# Patient Record
Sex: Male | Born: 1970 | Hispanic: No | Marital: Married | Smoking: Current every day smoker
Health system: Southern US, Community
[De-identification: ages and names within clinical notes are randomized; demographics above are authoritative.]

## PROBLEM LIST (undated history)

## (undated) DIAGNOSIS — F329 Major depressive disorder, single episode, unspecified: Secondary | ICD-10-CM

## (undated) DIAGNOSIS — E119 Type 2 diabetes mellitus without complications: Secondary | ICD-10-CM

## (undated) DIAGNOSIS — F32A Depression, unspecified: Secondary | ICD-10-CM

## (undated) DIAGNOSIS — K921 Melena: Secondary | ICD-10-CM

## (undated) DIAGNOSIS — N200 Calculus of kidney: Secondary | ICD-10-CM

## (undated) HISTORY — PX: OTHER SURGICAL HISTORY: SHX169

## (undated) HISTORY — PX: BACK SURGERY: SHX140

## (undated) HISTORY — PX: UMBILICAL HERNIA REPAIR: SHX196

---

## 1989-04-30 HISTORY — PX: FINGER NAIL SURGERY: SHX717

## 2001-04-04 ENCOUNTER — Inpatient Hospital Stay (HOSPITAL_COMMUNITY): Admission: EM | Admit: 2001-04-04 | Discharge: 2001-04-10 | Payer: Self-pay | Admitting: Psychiatry

## 2013-01-05 ENCOUNTER — Other Ambulatory Visit (HOSPITAL_COMMUNITY): Payer: Self-pay | Admitting: Orthopaedic Surgery

## 2013-01-09 ENCOUNTER — Encounter (HOSPITAL_COMMUNITY): Payer: Self-pay | Admitting: *Deleted

## 2013-01-09 MED ORDER — CEFAZOLIN SODIUM-DEXTROSE 2-3 GM-% IV SOLR
2.0000 g | INTRAVENOUS | Status: AC
  Administered 2013-01-10: 2 g via INTRAVENOUS
  Filled 2013-01-09: qty 50

## 2013-01-10 ENCOUNTER — Ambulatory Visit (HOSPITAL_COMMUNITY): Payer: Worker's Compensation | Admitting: Anesthesiology

## 2013-01-10 ENCOUNTER — Ambulatory Visit (HOSPITAL_COMMUNITY): Payer: Worker's Compensation

## 2013-01-10 ENCOUNTER — Observation Stay (HOSPITAL_COMMUNITY)
Admission: RE | Admit: 2013-01-10 | Discharge: 2013-01-11 | Disposition: A | Discharge status: Home / Self Care | Payer: Worker's Compensation | Source: Ambulatory Visit | Attending: Orthopaedic Surgery | Admitting: Orthopaedic Surgery

## 2013-01-10 ENCOUNTER — Encounter (HOSPITAL_COMMUNITY): Payer: Self-pay | Admitting: Pharmacy Technician

## 2013-01-10 ENCOUNTER — Encounter (HOSPITAL_COMMUNITY)
Admission: RE | Disposition: A | Discharge status: Home / Self Care | Payer: Self-pay | Source: Ambulatory Visit | Attending: Orthopaedic Surgery

## 2013-01-10 ENCOUNTER — Encounter (HOSPITAL_COMMUNITY): Payer: Self-pay | Admitting: Anesthesiology

## 2013-01-10 ENCOUNTER — Encounter (HOSPITAL_COMMUNITY): Payer: Self-pay

## 2013-01-10 DIAGNOSIS — R262 Difficulty in walking, not elsewhere classified: Secondary | ICD-10-CM | POA: Insufficient documentation

## 2013-01-10 DIAGNOSIS — E119 Type 2 diabetes mellitus without complications: Secondary | ICD-10-CM | POA: Insufficient documentation

## 2013-01-10 DIAGNOSIS — M5126 Other intervertebral disc displacement, lumbar region: Principal | ICD-10-CM | POA: Insufficient documentation

## 2013-01-10 HISTORY — DX: Type 2 diabetes mellitus without complications: E11.9

## 2013-01-10 HISTORY — DX: Major depressive disorder, single episode, unspecified: F32.9

## 2013-01-10 HISTORY — PX: LUMBAR LAMINECTOMY: SHX95

## 2013-01-10 HISTORY — DX: Depression, unspecified: F32.A

## 2013-01-10 HISTORY — DX: Melena: K92.1

## 2013-01-10 HISTORY — DX: Calculus of kidney: N20.0

## 2013-01-10 LAB — CBC
HCT: 41.2 % (ref 39.0–52.0)
Hemoglobin: 14.4 g/dL (ref 13.0–17.0)
MCH: 30.3 pg (ref 26.0–34.0)
MCHC: 35 g/dL (ref 30.0–36.0)
RDW: 13.1 % (ref 11.5–15.5)

## 2013-01-10 LAB — COMPREHENSIVE METABOLIC PANEL
Albumin: 3.3 g/dL — ABNORMAL LOW (ref 3.5–5.2)
BUN: 13 mg/dL (ref 6–23)
Calcium: 9.2 mg/dL (ref 8.4–10.5)
GFR calc Af Amer: >90 mL/min (ref >90)
Glucose, Bld: 280 mg/dL — ABNORMAL HIGH (ref 70–99)
Potassium: 4.1 mEq/L (ref 3.5–5.1)
Total Protein: 6.5 g/dL (ref 6.0–8.3)

## 2013-01-10 LAB — PROTIME-INR
INR: 0.95 (ref 0.00–1.49)
Prothrombin Time: 12.6 seconds (ref 11.6–15.2)

## 2013-01-10 LAB — SURGICAL PCR SCREEN: Staphylococcus aureus: NEGATIVE

## 2013-01-10 LAB — GLUCOSE, CAPILLARY
Glucose-Capillary: 255 mg/dL — ABNORMAL HIGH (ref 70–99)
Glucose-Capillary: 226 mg/dL — ABNORMAL HIGH (ref 70–99)

## 2013-01-10 SURGERY — MICRODISCECTOMY LUMBAR LAMINECTOMY
Anesthesia: General | Site: Back | Wound class: Clean

## 2013-01-10 MED ORDER — OXYCODONE-ACETAMINOPHEN 5-325 MG PO TABS
1.0000 | ORAL_TABLET | ORAL | Status: DC | PRN
  Administered 2013-01-10 – 2013-01-11 (×5): 2 via ORAL
  Filled 2013-01-10 (×5): qty 2

## 2013-01-10 MED ORDER — SENNOSIDES-DOCUSATE SODIUM 8.6-50 MG PO TABS: 1.0000 | ORAL_TABLET | Freq: Every evening | ORAL | Status: DC | PRN

## 2013-01-10 MED ORDER — CEFAZOLIN SODIUM 1-5 GM-% IV SOLN
1.0000 g | Freq: Three times a day (TID) | INTRAVENOUS | Status: AC
  Administered 2013-01-10 – 2013-01-11 (×2): 1 g via INTRAVENOUS
  Filled 2013-01-10 (×2): qty 50

## 2013-01-10 MED ORDER — HYDROMORPHONE HCL PF 1 MG/ML IJ SOLN
INTRAMUSCULAR | Status: AC
  Administered 2013-01-10: 0.5 mg
  Filled 2013-01-10: qty 1

## 2013-01-10 MED ORDER — KETOROLAC TROMETHAMINE 30 MG/ML IJ SOLN
30.0000 mg | Freq: Four times a day (QID) | INTRAMUSCULAR | Status: DC
  Administered 2013-01-10 – 2013-01-11 (×4): 30 mg via INTRAVENOUS
  Filled 2013-01-10 (×8): qty 1

## 2013-01-10 MED ORDER — ROCURONIUM BROMIDE 100 MG/10ML IV SOLN
INTRAVENOUS | Status: DC | PRN
  Administered 2013-01-10: 50 mg via INTRAVENOUS

## 2013-01-10 MED ORDER — FLEET ENEMA 7-19 GM/118ML RE ENEM: 1.0000 | ENEMA | Freq: Once | RECTAL | Status: AC | PRN

## 2013-01-10 MED ORDER — MIDAZOLAM HCL 5 MG/5ML IJ SOLN
INTRAMUSCULAR | Status: DC | PRN
  Administered 2013-01-10: 2 mg via INTRAVENOUS

## 2013-01-10 MED ORDER — NEOSTIGMINE METHYLSULFATE 1 MG/ML IJ SOLN
INTRAMUSCULAR | Status: DC | PRN
  Administered 2013-01-10: 3 mg via INTRAVENOUS

## 2013-01-10 MED ORDER — BUPIVACAINE HCL (PF) 0.25 % IJ SOLN
INTRAMUSCULAR | Status: AC
  Filled 2013-01-10: qty 30

## 2013-01-10 MED ORDER — METHOCARBAMOL 500 MG PO TABS: 500.0000 mg | ORAL_TABLET | Freq: Four times a day (QID) | ORAL | Status: DC | PRN

## 2013-01-10 MED ORDER — LIDOCAINE HCL (CARDIAC) 20 MG/ML IV SOLN
INTRAVENOUS | Status: DC | PRN
  Administered 2013-01-10: 60 mg via INTRAVENOUS

## 2013-01-10 MED ORDER — ZOLPIDEM TARTRATE 5 MG PO TABS: 5.0000 mg | ORAL_TABLET | Freq: Every evening | ORAL | Status: DC | PRN

## 2013-01-10 MED ORDER — SODIUM CHLORIDE 0.9 % IJ SOLN: 3.0000 mL | INTRAMUSCULAR | Status: DC | PRN

## 2013-01-10 MED ORDER — ACETAMINOPHEN 325 MG PO TABS: 650.0000 mg | ORAL_TABLET | ORAL | Status: DC | PRN

## 2013-01-10 MED ORDER — PROPOFOL 10 MG/ML IV BOLUS
INTRAVENOUS | Status: DC | PRN
  Administered 2013-01-10: 200 mg via INTRAVENOUS

## 2013-01-10 MED ORDER — OXYCODONE-ACETAMINOPHEN 5-325 MG PO TABS: 1.0000 | ORAL_TABLET | ORAL | Status: DC | PRN

## 2013-01-10 MED ORDER — SODIUM CHLORIDE 0.9 % IJ SOLN
3.0000 mL | Freq: Two times a day (BID) | INTRAMUSCULAR | Status: DC
  Administered 2013-01-11: 3 mL via INTRAVENOUS

## 2013-01-10 MED ORDER — CARBAMAZEPINE ER 200 MG PO TB12
200.0000 mg | ORAL_TABLET | Freq: Two times a day (BID) | ORAL | Status: DC
  Administered 2013-01-10 – 2013-01-11 (×2): 200 mg via ORAL
  Filled 2013-01-10 (×3): qty 1

## 2013-01-10 MED ORDER — THROMBIN 5000 UNITS EX SOLR
CUTANEOUS | Status: AC
  Filled 2013-01-10: qty 5000

## 2013-01-10 MED ORDER — MENTHOL 3 MG MT LOZG
1.0000 | LOZENGE | OROMUCOSAL | Status: DC | PRN
  Administered 2013-01-10: 3 mg via ORAL
  Filled 2013-01-10: qty 9

## 2013-01-10 MED ORDER — LAMOTRIGINE 200 MG PO TABS
200.0000 mg | ORAL_TABLET | Freq: Two times a day (BID) | ORAL | Status: DC
  Administered 2013-01-10 – 2013-01-11 (×2): 200 mg via ORAL
  Filled 2013-01-10 (×3): qty 1

## 2013-01-10 MED ORDER — PHENOL 1.4 % MT LIQD: 1.0000 | OROMUCOSAL | Status: DC | PRN

## 2013-01-10 MED ORDER — ONDANSETRON HCL 4 MG/2ML IJ SOLN
INTRAMUSCULAR | Status: DC | PRN
  Administered 2013-01-10: 4 mg via INTRAVENOUS

## 2013-01-10 MED ORDER — METHOCARBAMOL 500 MG PO TABS
500.0000 mg | ORAL_TABLET | Freq: Four times a day (QID) | ORAL | Status: DC | PRN
  Administered 2013-01-10 – 2013-01-11 (×3): 500 mg via ORAL
  Filled 2013-01-10 (×3): qty 1

## 2013-01-10 MED ORDER — 0.9 % SODIUM CHLORIDE (POUR BTL) OPTIME
TOPICAL | Status: DC | PRN
  Administered 2013-01-10: 1000 mL

## 2013-01-10 MED ORDER — HYDROMORPHONE HCL PF 1 MG/ML IJ SOLN
0.5000 mg | Freq: Once | INTRAMUSCULAR | Status: AC
  Administered 2013-01-10: 0.5 mg via INTRAVENOUS

## 2013-01-10 MED ORDER — MORPHINE SULFATE 2 MG/ML IJ SOLN: 1.0000 mg | INTRAMUSCULAR | Status: DC | PRN

## 2013-01-10 MED ORDER — DOCUSATE SODIUM 100 MG PO CAPS
100.0000 mg | ORAL_CAPSULE | Freq: Two times a day (BID) | ORAL | Status: DC
  Administered 2013-01-10 – 2013-01-11 (×2): 100 mg via ORAL
  Filled 2013-01-10 (×3): qty 1

## 2013-01-10 MED ORDER — ONDANSETRON HCL 4 MG/2ML IJ SOLN: 4.0000 mg | Freq: Once | INTRAMUSCULAR | Status: DC | PRN

## 2013-01-10 MED ORDER — METHOCARBAMOL 100 MG/ML IJ SOLN
500.0000 mg | Freq: Four times a day (QID) | INTRAVENOUS | Status: DC | PRN
  Filled 2013-01-10: qty 5

## 2013-01-10 MED ORDER — ACETAMINOPHEN 650 MG RE SUPP: 650.0000 mg | RECTAL | Status: DC | PRN

## 2013-01-10 MED ORDER — KCL IN DEXTROSE-NACL 20-5-0.45 MEQ/L-%-% IV SOLN
INTRAVENOUS | Status: DC
  Administered 2013-01-10: 19:00:00 via INTRAVENOUS
  Filled 2013-01-10 (×3): qty 1000

## 2013-01-10 MED ORDER — ONDANSETRON HCL 4 MG/2ML IJ SOLN: 4.0000 mg | INTRAMUSCULAR | Status: DC | PRN

## 2013-01-10 MED ORDER — LACTATED RINGERS IV SOLN: INTRAVENOUS | Status: DC

## 2013-01-10 MED ORDER — ACETAMINOPHEN 10 MG/ML IV SOLN
INTRAVENOUS | Status: AC
  Administered 2013-01-10: 1000 mg via INTRAVENOUS
  Filled 2013-01-10: qty 100

## 2013-01-10 MED ORDER — MUPIROCIN 2 % EX OINT
TOPICAL_OINTMENT | Freq: Two times a day (BID) | CUTANEOUS | Status: DC
  Administered 2013-01-10 – 2013-01-11 (×2): via NASAL

## 2013-01-10 MED ORDER — BISACODYL 10 MG RE SUPP: 10.0000 mg | Freq: Every day | RECTAL | Status: DC | PRN

## 2013-01-10 MED ORDER — HYDROCODONE-ACETAMINOPHEN 5-325 MG PO TABS
1.0000 | ORAL_TABLET | ORAL | Status: DC | PRN
  Administered 2013-01-10 – 2013-01-11 (×2): 2 via ORAL
  Filled 2013-01-10 (×2): qty 2

## 2013-01-10 MED ORDER — GLYCOPYRROLATE 0.2 MG/ML IJ SOLN
INTRAMUSCULAR | Status: DC | PRN
  Administered 2013-01-10: 0.4 mg via INTRAVENOUS

## 2013-01-10 MED ORDER — MORPHINE SULFATE 4 MG/ML IJ SOLN
INTRAMUSCULAR | Status: AC
  Filled 2013-01-10: qty 19

## 2013-01-10 MED ORDER — INSULIN ASPART 100 UNIT/ML ~~LOC~~ SOLN
8.0000 [IU] | Freq: Once | SUBCUTANEOUS | Status: AC
  Administered 2013-01-10: 8 [IU] via SUBCUTANEOUS
  Filled 2013-01-10: qty 0.08

## 2013-01-10 MED ORDER — ACETAMINOPHEN 10 MG/ML IV SOLN: 1000.0000 mg | Freq: Once | INTRAVENOUS | Status: DC | PRN

## 2013-01-10 MED ORDER — ARTIFICIAL TEARS OP OINT
TOPICAL_OINTMENT | OPHTHALMIC | Status: DC | PRN
  Administered 2013-01-10: 1 via OPHTHALMIC

## 2013-01-10 MED ORDER — MUPIROCIN 2 % EX OINT
TOPICAL_OINTMENT | CUTANEOUS | Status: AC
  Administered 2013-01-10: 1 via NASAL
  Filled 2013-01-10: qty 22

## 2013-01-10 MED ORDER — SODIUM CHLORIDE 0.9 % IV SOLN: 250.0000 mL | INTRAVENOUS | Status: DC

## 2013-01-10 MED ORDER — MORPHINE SULFATE 2 MG/ML IJ SOLN
1.0000 mg | INTRAMUSCULAR | Status: DC | PRN
  Administered 2013-01-10: 2 mg via INTRAVENOUS

## 2013-01-10 MED ORDER — HYDROMORPHONE HCL PF 1 MG/ML IJ SOLN
INTRAMUSCULAR | Status: AC
  Filled 2013-01-10: qty 1

## 2013-01-10 MED ORDER — LACTATED RINGERS IV SOLN
INTRAVENOUS | Status: DC | PRN
  Administered 2013-01-10: 12:00:00 via INTRAVENOUS

## 2013-01-10 MED ORDER — FENTANYL CITRATE 0.05 MG/ML IJ SOLN
INTRAMUSCULAR | Status: DC | PRN
  Administered 2013-01-10 (×2): 100 ug via INTRAVENOUS
  Administered 2013-01-10: 50 ug via INTRAVENOUS

## 2013-01-10 SURGICAL SUPPLY — 52 items
BLADE SURG CLIPPER 3M 9600 (MISCELLANEOUS) ×2 IMPLANT
BUR ROUND FLUTED 4 SOFT TCH (BURR) ×2 IMPLANT
CANISTER SUCTION 2500CC (MISCELLANEOUS) ×2 IMPLANT
CLOTH BEACON ORANGE TIMEOUT ST (SAFETY) ×2 IMPLANT
CORDS BIPOLAR (ELECTRODE) ×2 IMPLANT
COVER SURGICAL LIGHT HANDLE (MISCELLANEOUS) ×2 IMPLANT
DERMABOND ADVANCED (GAUZE/BANDAGES/DRESSINGS) ×1
DERMABOND ADVANCED .7 DNX12 (GAUZE/BANDAGES/DRESSINGS) ×1 IMPLANT
DRAPE MICROSCOPE LEICA (MISCELLANEOUS) ×2 IMPLANT
DRAPE PROXIMA HALF (DRAPES) IMPLANT
DRSG EMULSION OIL 3X3 NADH (GAUZE/BANDAGES/DRESSINGS) IMPLANT
DRSG MEPILEX BORDER 4X4 (GAUZE/BANDAGES/DRESSINGS) ×2 IMPLANT
DRSG MEPILEX BORDER 4X8 (GAUZE/BANDAGES/DRESSINGS) IMPLANT
DURAPREP 26ML APPLICATOR (WOUND CARE) ×2 IMPLANT
ELECT REM PT RETURN 9FT ADLT (ELECTROSURGICAL) ×2
ELECTRODE REM PT RTRN 9FT ADLT (ELECTROSURGICAL) ×1 IMPLANT
GLOVE BIOGEL PI IND STRL 7.0 (GLOVE) ×2 IMPLANT
GLOVE BIOGEL PI IND STRL 7.5 (GLOVE) ×1 IMPLANT
GLOVE BIOGEL PI IND STRL 8 (GLOVE) ×1 IMPLANT
GLOVE BIOGEL PI INDICATOR 7.0 (GLOVE) ×2
GLOVE BIOGEL PI INDICATOR 7.5 (GLOVE) ×1
GLOVE BIOGEL PI INDICATOR 8 (GLOVE) ×1
GLOVE ECLIPSE 7.0 STRL STRAW (GLOVE) ×2 IMPLANT
GLOVE ORTHO TXT STRL SZ7.5 (GLOVE) ×2 IMPLANT
GLOVE SURG SS PI 6.5 STRL IVOR (GLOVE) ×2 IMPLANT
GOWN PREVENTION PLUS LG XLONG (DISPOSABLE) ×4 IMPLANT
GOWN STRL NON-REIN LRG LVL3 (GOWN DISPOSABLE) ×4 IMPLANT
KIT BASIN OR (CUSTOM PROCEDURE TRAY) ×2 IMPLANT
KIT ROOM TURNOVER OR (KITS) ×2 IMPLANT
MANIFOLD NEPTUNE II (INSTRUMENTS) IMPLANT
NDL SUT .5 MAYO 1.404X.05X (NEEDLE) IMPLANT
NEEDLE HYPO 25GX1X1/2 BEV (NEEDLE) ×2 IMPLANT
NEEDLE MAYO TAPER (NEEDLE)
NEEDLE SPNL 18GX3.5 QUINCKE PK (NEEDLE) ×2 IMPLANT
NS IRRIG 1000ML POUR BTL (IV SOLUTION) ×2 IMPLANT
PACK LAMINECTOMY ORTHO (CUSTOM PROCEDURE TRAY) ×2 IMPLANT
PAD ARMBOARD 7.5X6 YLW CONV (MISCELLANEOUS) ×4 IMPLANT
PATTIES SURGICAL .5 X.5 (GAUZE/BANDAGES/DRESSINGS) IMPLANT
PATTIES SURGICAL .75X.75 (GAUZE/BANDAGES/DRESSINGS) IMPLANT
SPONGE GAUZE 4X4 12PLY (GAUZE/BANDAGES/DRESSINGS) IMPLANT
SUT VIC AB 0 CT1 27 (SUTURE) ×1
SUT VIC AB 0 CT1 27XBRD ANBCTR (SUTURE) ×1 IMPLANT
SUT VIC AB 2-0 CT1 27 (SUTURE) ×1
SUT VIC AB 2-0 CT1 TAPERPNT 27 (SUTURE) ×1 IMPLANT
SUT VICRYL 0 TIES 12 18 (SUTURE) IMPLANT
SUT VICRYL 4-0 PS2 18IN ABS (SUTURE) ×2 IMPLANT
SUT VICRYL AB 2 0 TIES (SUTURE) IMPLANT
SYR 20ML ECCENTRIC (SYRINGE) IMPLANT
SYR CONTROL 10ML LL (SYRINGE) ×2 IMPLANT
TOWEL OR 17X24 6PK STRL BLUE (TOWEL DISPOSABLE) ×2 IMPLANT
TOWEL OR 17X26 10 PK STRL BLUE (TOWEL DISPOSABLE) ×2 IMPLANT
WATER STERILE IRR 1000ML POUR (IV SOLUTION) IMPLANT

## 2013-01-10 NOTE — Anesthesia Postprocedure Evaluation (Signed)
  Anesthesia Post-op Note  Patient: Herbert Meyer  Procedure(s) Performed: Procedure(s) with comments: MICRODISCECTOMY LUMBAR LAMINECTOMY (N/A) - Bilateral Lumbar 4-5 Microdiscectomy  Patient Location: PACU  Anesthesia Type:General  Level of Consciousness: awake, alert  and oriented  Airway and Oxygen Therapy: Patient Spontanous Breathing and Patient connected to nasal cannula oxygen  Post-op Pain: mild  Post-op Assessment: Post-op Vital signs reviewed, Patient's Cardiovascular Status Stable, Respiratory Function Stable, Patent Airway and Pain level controlled  Post-op Vital Signs: stable  Complications: No apparent anesthesia complications

## 2013-01-10 NOTE — Progress Notes (Signed)
Pt. Reports that he has not been taking hypoglycemics, states stopped about 5 mths. Ago, ever since the the back problem he has not had meds. refilled for diabetes , therefore has not been getting treatment since. CBG- [phone] at 1245

## 2013-01-10 NOTE — Anesthesia Preprocedure Evaluation (Addendum)
Anesthesia Evaluation  Patient identified by MRN, date of birth, ID band Patient awake    Reviewed: Allergy & Precautions, H&P , NPO status , Patient's Chart, lab work & pertinent test results  History of Anesthesia Complications Negative for: history of anesthetic complications  Airway Mallampati: II TM Distance: >3 FB Neck ROM: Full    Dental  (+) Caps and Dental Advisory Given   Pulmonary Current Smoker,  breath sounds clear to auscultation        Cardiovascular negative cardio ROS  Rate:Normal     Neuro/Psych PSYCHIATRIC DISORDERS Depression  Neuromuscular disease (back pain)    GI/Hepatic negative GI ROS, Neg liver ROS,   Endo/Other  diabetes (has not taken meds x5 months), Type 2, Oral Hypoglycemic Agents  Renal/GU negative Renal ROS     Musculoskeletal  (+) Arthritis -, Osteoarthritis,    Abdominal   Peds negative pediatric ROS (+)  Hematology negative hematology ROS (+)   Anesthesia Other Findings   Reproductive/Obstetrics                       Anesthesia Physical Anesthesia Plan  ASA: III  Anesthesia Plan: General   Post-op Pain Management:    Induction: Intravenous  Airway Management Planned: Oral ETT  Additional Equipment:   Intra-op Plan:   Post-operative Plan: Extubation in OR  Informed Consent: I have reviewed the patients History and Physical, chart, labs and discussed the procedure including the risks, benefits and alternatives for the proposed anesthesia with the patient or authorized representative who has indicated his/her understanding and acceptance.   Dental advisory given  Plan Discussed with: CRNA and Anesthesiologist  Anesthesia Plan Comments: (HNP L4-5 Type 2 DM not taking meds, glucose 280 Smoker  Plan GA with oral ETT  Kipp Brood, MD)        Anesthesia Quick Evaluation

## 2013-01-10 NOTE — Progress Notes (Signed)
Pt 's son and spouse are present. His son speaks [place] and can interpret for spouse.

## 2013-01-10 NOTE — Brief Op Note (Cosign Needed)
01/10/2013  2:34 PM  PATIENT:  Herbert Meyer  42 y.o. 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  POST-OPERATIVE DIAGNOSIS:  L4-5 HNP  PROCEDURE:  Procedure(s) with comments: MICRODISCECTOMY LUMBAR LAMINECTOMY (N/A) - Bilateral Lumbar 4-5 Microdiscectomy  SURGEON:  Surgeon(s) and Role:    * Eldred Manges, MD - Primary  PHYSICIAN ASSISTANT:Emonee Winkowski Marita [place] PAC   ASSISTANTS: none   ANESTHESIA:   local and general  EBL:     BLOOD ADMINISTERED:none  DRAINS: none   LOCAL MEDICATIONS USED:  MARCAINE     SPECIMEN:  No Specimen  DISPOSITION OF SPECIMEN:  N/A  COUNTS:  YES  TOURNIQUET:  * No tourniquets in log *  DICTATION: .Note written in EPIC  PLAN OF CARE: Admit for overnight observation  PATIENT DISPOSITION:  PACU - hemodynamically stable.   Delay start of Pharmacological VTE agent (>24hrs) due to surgical blood loss or risk of bleeding: yes

## 2013-01-10 NOTE — H&P (Signed)
[facility]   A Division of [facility]   [address], [place], [place] [address] Telephone: [phone]  Fax: [phone]     PATIENT: Herbert Meyer, Herbert Meyer   MR#: [id number]  DOB: 03/30/1971       A 42 year old male returns with increasing pain, difficulty ambulating, 5 month history after on the job injury.  He has increased pain with activity, turning he has back pain, and right greater than left leg pain.  When he ambulates he starts having weakness in his legs, has to stop and sit.  When he sits he gets relief.  He has used Vicodin for pain and has had epidural steroid injections and states this did not give him relief.  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 on 11/09/2012.  Patient was on light work activity up until his epidural.  Work slip given on 12/04/2012, no work x2 weeks and he remains out of work.  He had been treated with muscle relaxants, anti-inflammatories, activity modification, has used tramadol, as well as Vicodin for pain without relief.     CURRENT MEDICATIONS:  He is a diabetic, he takes glipizide, metformin 5/500 daily, also sertraline 100 mg p.o. daily.     ALLERGIES:  He reports none.   PAST MEDICAL/SURGICAL HISTORY:  No previous surgery.   SOCIAL HISTORY:  He is married to his wife Herbert Meyer who is with him today.  He works in Production designer, theatre/television/film.  He is a nonsmoker.   FAMILY HISTORY:  Positive for diabetes.   REVIEW OF SYSTEMS:  Positive for diabetes.   PHYSICAL EXAMINATION:  Patient is alert and oriented, he is 5 feet 8 inches, 196 pounds, WD, WN, NAD.  Patient gets relief when he stops and leans over objects, he has increased pain after standing  for 10 minutes, gets relief with sitting or supine position and ambulation distance is limited to 2 blocks, which causes significant back pain and right leg weakness.  With forward neck flexion he complains of sharp low back pain, bilateral buttocks pain, and right greater than left leg pain.  He is able to get up slowly from sitting to standing position, ambulates in the office, can get on and off the exam table.  No accessory muscle inspiratory effort.  Hip range of motion is normal, negative Faber test.  There is tenderness to palpation at L4-5, L5-S1 interspace.  Right greater than left sciatic notch tenderness.  Positive straight leg raising at 70 degrees bilaterally right and left.  Negative prone reverse straight leg raising.  Skin over his back is normal.  Quads, hip flexors, hamstrings, are normal to strength testing.  Anterior tib, EHL, toe flexors, toe extensors, peroneals, and anterior tib remain normal with good strength.   RADIOGRAPHS:  MRI scan from 11/08/2012 showed some narrowing at 3-4.  There is significant central stenosis at the L4-5 level with subarticular lateral recess stenosis with large disk bulge, central disk protrusion causing significant stenosis.  The 5-1 level is non-compressed, there is minimal disk bulge and canal diameter is 11 mm. Patient remains stoic, he has been working, states that his pain has progressed to the point he cannot stand it any longer.  He has not  gotten relief with anti-inflammatories, been treated with a Medrol Dosepak, pain medication, activity modification.  Rehab nurse, Dianah Field, R.N., BSN is present and I reviewed scan with the patient, patient's wife, also the rehab nurse, Dianah Field, R.N.  Onset of pain was on 08/31/2012 when he was picking up a metal bar that weighed approximately 150 pounds and had sharp pain in his back radiating to his buttocks, right greater than left buttocks.   ASSESSMENT:  L4-5 disk protrusion with bilateral subarticular  lateral recess stenosis with large disk bulge and central canal stenosis.  We are now more than 4 months out from his injury.  He is having persistent symptoms with claudication due to the stenosis from large disk bulge at the 4-5 level.  This stenosis is at the 4-5 level, isolated at the level of the disk due to the large disk bulge.    PLAN:  Would be microdiskectomy L4-5 on the right-side with possible bilateral approach for his lateral recess stenosis from large disk bulge.  Plan would be overnight stay, use of the operative microscope, usual length of time out of work would be 6-7 weeks.  He would be on modified light duty with limitation of lifting for a few weeks and then should be able to return to normal work activities.  I discussed with him that 3-4 level shows some evidence of mild to moderate stenosis and might be a problem for him at some point in the distant past.  I do not think it requires operative treatment at this time and single level treatment should give him significant improvement and allow him to return to work activities.  Patient is interested in resuming work activity and has a good work history.  We will proceed with surgical scheduling once we obtain Worker's Comp approval.  Work slip given for no work pending surgical approval.     For additional information please see handwritten notes, reports, orders and prescriptions in this chart.      Mark C. Ophelia Charter, M.D.    Auto-Authenticated by Veverly Fells. Ophelia Charter, M.D.

## 2013-01-10 NOTE — Interval H&P Note (Signed)
History and Physical Interval Note:  01/10/2013 12:42 PM  Herbert Meyer  has presented today for surgery, with the diagnosis of L4-5 HNP  The various methods of treatment have been discussed with the patient and family. After consideration of risks, benefits and other options for treatment, the patient has consented to  Procedure(s) with comments: MICRODISCECTOMY LUMBAR LAMINECTOMY (N/A) - Right L4-5 Microdiscectomy, Possible Bilateral  as a surgical intervention .  The patient's history has been reviewed, patient examined, no change in status, stable for surgery.  I have reviewed the patient's chart and labs.  Questions were answered to the patient's satisfaction.     Azar South C

## 2013-01-10 NOTE — Preoperative (Signed)
Beta Blockers   Reason not to administer Beta Blockers:Not Applicable 

## 2013-01-10 NOTE — Transfer of Care (Signed)
Immediate Anesthesia Transfer of Care Note  Patient: Herbert Meyer  Procedure(s) Performed: Procedure(s) with comments: MICRODISCECTOMY LUMBAR LAMINECTOMY (N/A) - Bilateral Lumbar 4-5 Microdiscectomy  Patient Location: PACU  Anesthesia Type:General  Level of Consciousness: awake, alert , oriented and patient cooperative  Airway & Oxygen Therapy: Patient Spontanous Breathing and Patient connected to nasal cannula oxygen  Post-op Assessment: Report given to PACU RN and Post -op Vital signs reviewed and stable  Post vital signs: Reviewed and stable  Complications: No apparent anesthesia complications

## 2013-01-10 NOTE — Op Note (Signed)
Preop diagnosis: L4-5 moderate disc protrusion with spinal stenosis.  Postop diagnosis: Same  Procedure: L4-5 bilateral lateral repeat assessment decompression and bilateral microdiscectomy.  Surgeon: Annell Greening M.D.  Asst.: Maud Deed PA-C medically necessary and present for the entire procedure  Anesthesia: GLT plus Marcaine skin local  Consultations: None   Findings large disc protrusion with pseudo-spinal stenosis and bilateral lateral excess compression multifactorial  Procedure: After induction general anesthesia preoperative Ancef prophylaxis patient was placed prone on chest rolls careful padding positioning shoulder yellow pads and arms at 99 the with pads underneath the ulnar nerve. Back was prepped with DuraPrep SCDs are placed. Lumbar area square totalis Betadine Steri-Drape applied after the area been shaved before prepping and laminectomy sheets and drapes were applied. Timeout procedure was completed. Needle localization with the needle directly over the 45 interspace based on palpable landmarks was performed in crosstable lateral confirm that this is at the appropriate level.  Midline incision was made subcutaneous laminar dissection on the right side was performed. Patient more right leg symptoms. The Giemsa ligament removed at the lamina was thinned with the bur laminotomy was performed to the small portion of the top of L5 was opened up with laminotomy and about half of the L4 lamina on the right side. The Giemsa ligament were removed. Dura was encountered there was large the ligament pieces removed in the lateral gutter and the dura was visualized bulging over the a large disc protrusion. Some veins in the gutter were controlled with the bipolar cautery using operative microscope. Prince Solian was used to protect the dura and nerve root and annulus was incised. Chunks of disc removed with the micro-a straight pituitaries up and down biting pituitaries Epstein curettes were used  this was cleaned out centrally large protruding disc was pushed down with combination of the dural separator as well as the largest Epstein twisted this down into the central portion and then removed the chunks of disc so was not causing neurocompression. Once the right side was cleaned out hockey-stick could be passed anteriorly. Bulging of the disc and the OpSite was noted and a microscope was turned 180 for better light intensity and the slipping on the left side of spinous process laminotomy was performed removing about half removing thick Giemsa ligament and lateral recess decompression was performed. Epidural veins were coagulated with bipolar annulus was again noted to be large bulging causing compression was incised and numerous chunks of disc removed progressing toward the midline until the disc was flat and hockey-stick could be passed anterior the dura without areas of compression. Foraminal was opened and this fragments are placed up laterally. Both the bilateral sides were copiously irrigated with saline solution upper microscope was removed in standard layer closure with #1 Vicryl 2-0 Vicryl for Vicryl subcuticular closure. Dermabond was applied postoperative dressing and transferred recovery in stable condition. Instrument count needle count was correct. Procedure was as planned.                       Annell Greening M.D.

## 2013-01-10 NOTE — Anesthesia Procedure Notes (Signed)
Procedure Name: Intubation Date/Time: 01/10/2013 1:00 PM Performed by: Leona Singleton A Pre-anesthesia Checklist: Patient identified Patient Re-evaluated:Patient Re-evaluated prior to inductionOxygen Delivery Method: Circle system utilized Preoxygenation: Pre-oxygenation with 100% oxygen Intubation Type: IV induction Ventilation: Mask ventilation without difficulty Tube type: Oral Tube size: 7.5 mm Number of attempts: 1 Airway Equipment and Method: Stylet and Video-laryngoscopy Placement Confirmation: ETT inserted through vocal cords under direct vision,  positive ETCO2 and breath sounds checked- equal and bilateral Secured at: 23 cm Tube secured with: Tape Dental Injury: Teeth and Oropharynx as per pre-operative assessment  Difficulty Due To: Difficulty was anticipated, Difficult Airway- due to large tongue and Difficult Airway- due to anterior larynx

## 2013-01-11 NOTE — Progress Notes (Signed)
Rept Katy Fitch PA. Pt requests to stay in hospital another night. Pt is ambulating to bathroom, tolerating a diet, vital signs stable and voiding without incident. Pt is receiving Robaxin and Percocet for pain and pt sleeps between doses. Pt only ambulates to bathroom. Pt otherwise rests in bed. Wife is at bedside and supportive. Pt and wife request a walker for home safety. Order obtained for RW. Pt offered to stay another night but due to no current medical necessity pt may receive a bill as per Katy Fitch PA's order.

## 2013-01-11 NOTE — Progress Notes (Signed)
UR COMPLETED  

## 2013-01-11 NOTE — Progress Notes (Signed)
Inpatient Diabetes Program Recommendations  AACE/ADA: New Consensus Statement on Inpatient Glycemic Control (2013)  Target Ranges:  Prepandial:   less than 140 mg/dL      Peak postprandial:   less than 180 mg/dL (1-2 hours)      Critically ill patients:  140 - 180 mg/dL  Results for Herbert Meyer, Herbert Meyer (MRN [id number]) as of 01/11/2013 13:46  Ref. Range 01/10/2013 12:40 01/10/2013 14:45 01/10/2013 17:47 01/10/2013 21:34 01/11/2013 06:56  Glucose-Capillary Latest Range: 70-99 mg/dL 409 (H) 811 (H) 914 (H) 305 (H) 280 (H)   Inpatient Diabetes Program Recommendations Correction (SSI): start Novolog MODERATE scale TID + HS scale per Glycemic Control Order Set HgbA1C: order to assess prehospital glucose control Thank you  Piedad Climes BSN, RN,CDE Inpatient Diabetes Coordinator [phone] (team pager)

## 2013-01-11 NOTE — Progress Notes (Signed)
Patient ambulated the hall with nursing assistant 50 plus feet.

## 2013-01-11 NOTE — Progress Notes (Signed)
Subjective: 1 Day Post-Op Procedure(s) (LRB): MICRODISCECTOMY LUMBAR LAMINECTOMY (N/A) Patient reports pain as mild.  No leg pain.  Walking in hallway.  Voiding and eating ,+flatus  Objective: Vital signs in last 24 hours: Temp:  [97 F (36.1 C)-98.7 F (37.1 C)] 98.7 F (37.1 C) (05/15 0631) Pulse Rate:  [59-90] 78 (05/15 0631) Resp:  [13-20] 16 (05/15 0631) BP: (101-121)/(66-87) 118/66 mmHg (05/15 0631) SpO2:  [94 %-100 %] 96 % (05/15 0631) Weight:  [82.501 kg (181 lb 14.1 oz)] 82.501 kg (181 lb 14.1 oz) (05/14 1202)  Intake/Output from previous day: 05/14 0701 - 05/15 0700 In: 1550 [P.O.:600; I.V.:950] Out: 1025 [Urine:1000; Blood:25] Intake/Output this shift:     Recent Labs  01/10/13 1128  HGB 14.4    Recent Labs  01/10/13 1128  WBC 10.2  RBC 4.75  HCT 41.2  PLT 190    Recent Labs  01/10/13 1128  NA 138  K 4.1  CL 102  CO2 27  BUN 13  CREATININE 0.71  GLUCOSE 280*  CALCIUM 9.2    Recent Labs  01/10/13 1128  INR 0.95    Neurovascular intact Intact pulses distally Dorsiflexion/Plantar flexion intact Incision: no drainage  Assessment/Plan: 1 Day Post-Op Procedure(s) (LRB): MICRODISCECTOMY LUMBAR LAMINECTOMY (N/A) Advance diet D/C IV fluids Discharge home today  [place] M 01/11/2013, 8:26 AM

## 2013-01-12 ENCOUNTER — Encounter (HOSPITAL_COMMUNITY): Payer: Self-pay | Admitting: Orthopaedic Surgery

## 2013-01-16 NOTE — Discharge Summary (Signed)
Physician Discharge Summary  Patient ID: Herbert Meyer MRN: [id number] DOB/AGE: 1970/11/29 42 y.o.  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  Past Medical History  Diagnosis Date  . Diabetes mellitus without complication   . Depression   . Kidney stones     passed  . Blood in stool     Surgeries: Procedure(s): MICRODISCECTOMY LUMBAR LAMINECTOMY bilateral L4-5 on 01/10/2013   Consultants (if any):  none  Discharged Condition: Improved  Hospital Course: Herbert Meyer is an 42 y.o. male who was admitted 01/10/2013 with a diagnosis of HNP (herniated nucleus pulposus), lumbar and went to the operating room on 01/10/2013 and underwent the above named procedures.    He was given perioperative antibiotics:  Anti-infectives   Start     Dose/Rate Route Frequency Ordered Stop   01/10/13 1730  ceFAZolin (ANCEF) IVPB 1 g/50 mL premix     1 g 100 mL/hr over 30 Minutes Intravenous Every 8 hours 01/10/13 1722 01/11/13 0120   01/10/13 0600  ceFAZolin (ANCEF) IVPB 2 g/50 mL premix     2 g 100 mL/hr over 30 Minutes Intravenous On call to O.R. 01/09/13 1412 01/10/13 1304    . Sliding scale insulin was used to manage his diabetes. He was given sequential compression devices, early ambulation for DVT prophylaxis.  He benefited maximally from the hospital stay and there were no complications.    Recent vital signs:  Filed Vitals:   01/11/13 1429  BP: 110/58  Pulse: 72  Temp: 97.6 F (36.4 C)  Resp: 17    Recent laboratory studies:  Lab Results  Component Value Date   HGB 14.4 01/10/2013   Lab Results  Component Value Date   WBC 10.2 01/10/2013   PLT 190 01/10/2013   Lab Results  Component Value Date   INR 0.95 01/10/2013   Lab Results  Component  Value Date   NA 138 01/10/2013   K 4.1 01/10/2013   CL 102 01/10/2013   CO2 27 01/10/2013   BUN 13 01/10/2013   CREATININE 0.71 01/10/2013   GLUCOSE 280* 01/10/2013    Discharge Medications:     Medication List    STOP taking these medications       oxyCODONE 5 MG immediate release tablet  Commonly known as:  Oxy IR/ROXICODONE      TAKE these medications       carbamazepine 200 MG 12 hr tablet  Commonly known as:  TEGRETOL XR  Take 200 mg by mouth 2 (two) times daily.     lamoTRIgine 100 MG tablet  Commonly known as:  LAMICTAL  Take 200 mg by mouth 2 (two) times daily.     meloxicam 15 MG tablet  Commonly known as:  MOBIC  Take 15 mg by mouth daily.     methocarbamol 500 MG tablet  Commonly known as:  ROBAXIN  Take 1 tablet (500 mg total) by mouth every 6 (six) hours as needed (spasm).     oxyCODONE-acetaminophen 5-325 MG per tablet  Commonly known as:  ROXICET  Take 1-2 tablets by mouth every 4 (four) hours as needed for pain.        Diagnostic Studies: Dg Chest 2 View  01/10/2013   *RADIOLOGY REPORT*  Clinical Data: Preop lumbar spine surgery  CHEST - 2 VIEW  Comparison:  03/12/2009  Findings: Lungs are clear. No pleural effusion or pneumothorax.  Cardiomediastinal silhouette is within normal limits.  Visualized osseous structures are within normal limits.  IMPRESSION: No evidence of acute cardiopulmonary disease.   Original Report Authenticated By: Charline Bills, M.D.   Dg Lumbar Spine 2-3 Views  01/10/2013   *RADIOLOGY REPORT*  Clinical Data: L4-5 discectomy.  LUMBAR SPINE - 2-3 VIEW  Comparison: MRI lumbar spine 11/08/2012.  Findings: We are provided with two-view intraoperative views of the lumbar spine in the lateral projection.  Both images demonstrate localization of L4-5.  IMPRESSION: L4-5 localization.   Original Report Authenticated By: Holley Dexter, M.D.    Disposition: 01-Home or Self Care      Discharge Orders   Future Orders Complete By Expires      Call MD / Call 911  As directed     Comments:      If you experience chest pain or shortness of breath, CALL 911 and be transported to the hospital emergency room.  If you develope a fever above 101 F, pus (white drainage) or increased drainage or redness at the wound, or calf pain, call your surgeon's office.    Constipation Prevention  As directed     Comments:      Drink plenty of fluids.  Prune juice may be helpful.  You may use a stool softener, such as Colace (over the counter) 100 mg twice a day.  Use MiraLax (over the counter) for constipation as needed.    Diet - low sodium heart healthy  As directed     Discharge instructions  As directed     Comments:      No lifting greater than 10 lbs. Avoid bending, stooping and twisting. Walk in house for first week them may start to get out slowly increasing distance up to one mile by 3 weeks post op. Keep incision dry for 3 days, may use tegaderm or similar water impervious dressing. Ice packs to back to help with swelling and pain    Driving restrictions  As directed     Comments:      No driving while taking pain medications    Increase activity slowly as tolerated  As directed     Lifting restrictions  As directed     Comments:      No lifting       Follow-up Information   Follow up with YATES,MARK C, MD. Schedule an appointment as soon as possible for a visit in 2 weeks.   Contact information:   [address] [place] [address] [phone]        Signed: Wende Neighbors 01/16/2013, 3:51 PM

## 2013-06-11 ENCOUNTER — Other Ambulatory Visit (HOSPITAL_COMMUNITY): Payer: Self-pay | Admitting: Orthopaedic Surgery

## 2013-06-20 ENCOUNTER — Encounter (HOSPITAL_COMMUNITY)
Admission: RE | Admit: 2013-06-20 | Discharge: 2013-06-20 | Disposition: A | Discharge status: Home / Self Care | Payer: Worker's Compensation | Source: Ambulatory Visit | Attending: Orthopaedic Surgery | Admitting: Orthopaedic Surgery

## 2013-06-20 ENCOUNTER — Encounter (HOSPITAL_COMMUNITY): Payer: Self-pay

## 2013-06-20 DIAGNOSIS — Z01818 Encounter for other preprocedural examination: Secondary | ICD-10-CM | POA: Insufficient documentation

## 2013-06-20 DIAGNOSIS — Z01812 Encounter for preprocedural laboratory examination: Secondary | ICD-10-CM | POA: Insufficient documentation

## 2013-06-20 LAB — SURGICAL PCR SCREEN: MRSA, PCR: NEGATIVE

## 2013-06-20 LAB — NO BLOOD PRODUCTS

## 2013-06-20 LAB — COMPREHENSIVE METABOLIC PANEL
ALT: 14 U/L (ref 0–53)
AST: 11 U/L (ref 0–37)
Albumin: 4 g/dL (ref 3.5–5.2)
CO2: 24 mEq/L (ref 19–32)
Calcium: 9.5 mg/dL (ref 8.4–10.5)
Chloride: 101 mEq/L (ref 96–112)
GFR calc non Af Amer: >90 mL/min (ref >90)
Sodium: 137 mEq/L (ref 135–145)

## 2013-06-20 LAB — PROTIME-INR: INR: 0.99 (ref 0.00–1.49)

## 2013-06-20 LAB — CBC
MCH: 31.2 pg (ref 26.0–34.0)
Platelets: 277 10*3/uL (ref 150–400)
RBC: 4.78 MIL/uL (ref 4.22–5.81)
RDW: 12.9 % (ref 11.5–15.5)
WBC: 10.8 10*3/uL — ABNORMAL HIGH (ref 4.0–10.5)

## 2013-06-20 NOTE — Progress Notes (Addendum)
Pt has stated he does not want a blood transfusion....refusal signed and sent to blood bank.  (I have asked him several times about receiving blood---he doesn't want it)  DA  He does not take tegretol for seizures..states he takes it for right side facial pain or discomfort???  da

## 2013-06-20 NOTE — Pre-Procedure Instructions (Signed)
Herbert Meyer  06/20/2013   Your procedure is scheduled on: Monday, Oct. 27th    Report to [facility]  2 * 3 at 10:30 AM.  Call this number if you have problems the morning of surgery: [phone]   Remember:   Do not eat food or drink liquids after midnight Sunday.   Take these medicines the morning of surgery with A SIP OF WATER: Tegretol, Oxycodone, Methocarbamol   Do not wear jewelry.  Do not wear lotions, powders, or colognes. You may wear deodorant.   Men may shave face and neck.   Do not bring valuables to the hospital.  [facility] is not responsible for any belongings or valuables.               Contacts, dentures or bridgework may not be worn into surgery.  Leave suitcase in the car. After surgery it may be brought to your room.  For patients admitted to the hospital, discharge time is determined by your treatment team.               Name and phone number of your driver:    Special Instructions: Shower using CHG 2 nights before surgery and the night before surgery.  If you shower the day of surgery use CHG.  Use special wash - you have one bottle of CHG for all showers.  You should use approximately 1/3 of the bottle for each shower.   Please read over the following fact sheets that you were given: Pain Booklet, Blood Transfusion Information, MRSA Information and Surgical Site Infection Prevention

## 2013-06-24 MED ORDER — CEFAZOLIN SODIUM-DEXTROSE 2-3 GM-% IV SOLR
2.0000 g | INTRAVENOUS | Status: AC
  Administered 2013-06-25 (×2): 2 g via INTRAVENOUS
  Filled 2013-06-24: qty 50

## 2013-06-25 ENCOUNTER — Encounter (HOSPITAL_COMMUNITY)
Admission: RE | Disposition: A | Discharge status: Home / Self Care | Payer: Worker's Compensation | Source: Ambulatory Visit | Attending: Orthopaedic Surgery

## 2013-06-25 ENCOUNTER — Inpatient Hospital Stay (HOSPITAL_COMMUNITY)
Admission: RE | Admit: 2013-06-25 | Discharge: 2013-06-28 | DRG: 460 | Disposition: A | Discharge status: Home / Self Care | Payer: Worker's Compensation | Source: Ambulatory Visit | Attending: Orthopaedic Surgery | Admitting: Orthopaedic Surgery

## 2013-06-25 ENCOUNTER — Inpatient Hospital Stay (HOSPITAL_COMMUNITY): Payer: Worker's Compensation | Admitting: Anesthesiology

## 2013-06-25 ENCOUNTER — Inpatient Hospital Stay (HOSPITAL_COMMUNITY): Payer: Worker's Compensation

## 2013-06-25 ENCOUNTER — Encounter (HOSPITAL_COMMUNITY): Payer: Worker's Compensation | Admitting: Anesthesiology

## 2013-06-25 ENCOUNTER — Encounter (HOSPITAL_COMMUNITY): Payer: Self-pay | Admitting: *Deleted

## 2013-06-25 DIAGNOSIS — F329 Major depressive disorder, single episode, unspecified: Secondary | ICD-10-CM | POA: Diagnosis present

## 2013-06-25 DIAGNOSIS — M5126 Other intervertebral disc displacement, lumbar region: Principal | ICD-10-CM | POA: Diagnosis present

## 2013-06-25 DIAGNOSIS — Z79899 Other long term (current) drug therapy: Secondary | ICD-10-CM

## 2013-06-25 DIAGNOSIS — E119 Type 2 diabetes mellitus without complications: Secondary | ICD-10-CM | POA: Diagnosis present

## 2013-06-25 DIAGNOSIS — M48061 Spinal stenosis, lumbar region without neurogenic claudication: Secondary | ICD-10-CM | POA: Diagnosis present

## 2013-06-25 DIAGNOSIS — F17200 Nicotine dependence, unspecified, uncomplicated: Secondary | ICD-10-CM | POA: Diagnosis present

## 2013-06-25 DIAGNOSIS — F3289 Other specified depressive episodes: Secondary | ICD-10-CM | POA: Diagnosis present

## 2013-06-25 LAB — GLUCOSE, CAPILLARY: Glucose-Capillary: 104 mg/dL — ABNORMAL HIGH (ref 70–99)

## 2013-06-25 SURGERY — POSTERIOR LUMBAR FUSION 1 LEVEL
Anesthesia: General | Site: Back | Laterality: Left | Wound class: Clean

## 2013-06-25 MED ORDER — KETOROLAC TROMETHAMINE 30 MG/ML IJ SOLN
INTRAMUSCULAR | Status: AC
  Filled 2013-06-25: qty 1

## 2013-06-25 MED ORDER — HYDROMORPHONE HCL PF 1 MG/ML IJ SOLN
0.2500 mg | INTRAMUSCULAR | Status: DC | PRN
  Administered 2013-06-25 (×4): 0.5 mg via INTRAVENOUS

## 2013-06-25 MED ORDER — HEMOSTATIC AGENTS (NO CHARGE) OPTIME
TOPICAL | Status: DC | PRN
  Administered 2013-06-25: 1 via TOPICAL

## 2013-06-25 MED ORDER — GLYCOPYRROLATE 0.2 MG/ML IJ SOLN
INTRAMUSCULAR | Status: DC | PRN
  Administered 2013-06-25: 0.4 mg via INTRAVENOUS

## 2013-06-25 MED ORDER — OXYCODONE HCL 5 MG/5ML PO SOLN: 5.0000 mg | Freq: Once | ORAL | Status: DC | PRN

## 2013-06-25 MED ORDER — CANAGLIFLOZIN 100 MG PO TABS: 100.0000 mg | ORAL_TABLET | Freq: Every day | ORAL | Status: DC

## 2013-06-25 MED ORDER — METHOCARBAMOL 100 MG/ML IJ SOLN
500.0000 mg | Freq: Four times a day (QID) | INTRAVENOUS | Status: DC | PRN
  Filled 2013-06-25: qty 5

## 2013-06-25 MED ORDER — SODIUM CHLORIDE 0.9 % IJ SOLN: 3.0000 mL | INTRAMUSCULAR | Status: DC | PRN

## 2013-06-25 MED ORDER — SODIUM CHLORIDE 0.9 % IV SOLN: 250.0000 mL | INTRAVENOUS | Status: DC

## 2013-06-25 MED ORDER — FENTANYL CITRATE 0.05 MG/ML IJ SOLN
INTRAMUSCULAR | Status: DC | PRN
  Administered 2013-06-25: 100 ug via INTRAVENOUS
  Administered 2013-06-25: 50 ug via INTRAVENOUS
  Administered 2013-06-25 (×2): 100 ug via INTRAVENOUS
  Administered 2013-06-25: 50 ug via INTRAVENOUS
  Administered 2013-06-25: 100 ug via INTRAVENOUS
  Administered 2013-06-25: 150 ug via INTRAVENOUS
  Administered 2013-06-25: 100 ug via INTRAVENOUS
  Administered 2013-06-25: 50 ug via INTRAVENOUS
  Administered 2013-06-25 (×2): 100 ug via INTRAVENOUS

## 2013-06-25 MED ORDER — CARBAMAZEPINE ER 200 MG PO TB12
200.0000 mg | ORAL_TABLET | Freq: Two times a day (BID) | ORAL | Status: DC
  Administered 2013-06-25 – 2013-06-28 (×6): 200 mg via ORAL
  Filled 2013-06-25 (×7): qty 1

## 2013-06-25 MED ORDER — LAMOTRIGINE 200 MG PO TABS
200.0000 mg | ORAL_TABLET | Freq: Two times a day (BID) | ORAL | Status: DC
  Administered 2013-06-25 – 2013-06-28 (×5): 200 mg via ORAL
  Filled 2013-06-25 (×9): qty 1

## 2013-06-25 MED ORDER — POTASSIUM CHLORIDE IN NACL 20-0.45 MEQ/L-% IV SOLN
INTRAVENOUS | Status: DC
  Administered 2013-06-25: 75 mL/h via INTRAVENOUS
  Filled 2013-06-25 (×4): qty 1000

## 2013-06-25 MED ORDER — ONDANSETRON HCL 4 MG/2ML IJ SOLN
INTRAMUSCULAR | Status: DC | PRN
  Administered 2013-06-25: 4 mg via INTRAVENOUS

## 2013-06-25 MED ORDER — HYDROMORPHONE HCL PF 1 MG/ML IJ SOLN
INTRAMUSCULAR | Status: AC
  Administered 2013-06-25: 0.5 mg via INTRAVENOUS
  Filled 2013-06-25: qty 2

## 2013-06-25 MED ORDER — MIDAZOLAM HCL 5 MG/5ML IJ SOLN
INTRAMUSCULAR | Status: DC | PRN
  Administered 2013-06-25: 2 mg via INTRAVENOUS

## 2013-06-25 MED ORDER — KETOROLAC TROMETHAMINE 30 MG/ML IJ SOLN
30.0000 mg | Freq: Three times a day (TID) | INTRAMUSCULAR | Status: AC
  Administered 2013-06-25 – 2013-06-26 (×3): 30 mg via INTRAVENOUS
  Filled 2013-06-25 (×2): qty 1

## 2013-06-25 MED ORDER — SENNOSIDES-DOCUSATE SODIUM 8.6-50 MG PO TABS: 1.0000 | ORAL_TABLET | Freq: Every evening | ORAL | Status: DC | PRN

## 2013-06-25 MED ORDER — FLEET ENEMA 7-19 GM/118ML RE ENEM: 1.0000 | ENEMA | Freq: Once | RECTAL | Status: AC | PRN

## 2013-06-25 MED ORDER — OXYCODONE-ACETAMINOPHEN 5-325 MG PO TABS
1.0000 | ORAL_TABLET | ORAL | Status: DC | PRN
  Administered 2013-06-25 – 2013-06-28 (×4): 2 via ORAL
  Filled 2013-06-25 (×4): qty 2

## 2013-06-25 MED ORDER — THROMBIN 20000 UNITS EX KIT
PACK | CUTANEOUS | Status: DC | PRN
  Administered 2013-06-25: 20000 [IU] via TOPICAL

## 2013-06-25 MED ORDER — LACTATED RINGERS IV SOLN
INTRAVENOUS | Status: DC
  Administered 2013-06-25: 10:00:00 via INTRAVENOUS

## 2013-06-25 MED ORDER — ZOLPIDEM TARTRATE 5 MG PO TABS
5.0000 mg | ORAL_TABLET | Freq: Every evening | ORAL | Status: DC | PRN
  Administered 2013-06-26 – 2013-06-27 (×3): 5 mg via ORAL
  Filled 2013-06-25 (×3): qty 1

## 2013-06-25 MED ORDER — ONDANSETRON HCL 4 MG/2ML IJ SOLN: 4.0000 mg | INTRAMUSCULAR | Status: DC | PRN

## 2013-06-25 MED ORDER — HYDROCODONE-ACETAMINOPHEN 5-325 MG PO TABS
1.0000 | ORAL_TABLET | ORAL | Status: DC | PRN
  Administered 2013-06-25 – 2013-06-28 (×12): 2 via ORAL
  Filled 2013-06-25 (×13): qty 2

## 2013-06-25 MED ORDER — NEOSTIGMINE METHYLSULFATE 1 MG/ML IJ SOLN
INTRAMUSCULAR | Status: DC | PRN
  Administered 2013-06-25: 3 mg via INTRAVENOUS

## 2013-06-25 MED ORDER — BISACODYL 10 MG RE SUPP: 10.0000 mg | Freq: Every day | RECTAL | Status: DC | PRN

## 2013-06-25 MED ORDER — CEFAZOLIN SODIUM 1-5 GM-% IV SOLN
1.0000 g | Freq: Three times a day (TID) | INTRAVENOUS | Status: AC
  Administered 2013-06-26 (×2): 1 g via INTRAVENOUS
  Filled 2013-06-25 (×2): qty 50

## 2013-06-25 MED ORDER — 0.9 % SODIUM CHLORIDE (POUR BTL) OPTIME
TOPICAL | Status: DC | PRN
  Administered 2013-06-25 (×2): 1000 mL

## 2013-06-25 MED ORDER — METHOCARBAMOL 500 MG PO TABS
500.0000 mg | ORAL_TABLET | Freq: Four times a day (QID) | ORAL | Status: DC | PRN
  Administered 2013-06-25 – 2013-06-28 (×7): 500 mg via ORAL
  Filled 2013-06-25 (×7): qty 1

## 2013-06-25 MED ORDER — PROPOFOL 10 MG/ML IV BOLUS
INTRAVENOUS | Status: DC | PRN
  Administered 2013-06-25: 140 mg via INTRAVENOUS

## 2013-06-25 MED ORDER — INSULIN ASPART 100 UNIT/ML ~~LOC~~ SOLN
0.0000 [IU] | Freq: Three times a day (TID) | SUBCUTANEOUS | Status: DC
  Administered 2013-06-26 – 2013-06-28 (×4): 2 [IU] via SUBCUTANEOUS

## 2013-06-25 MED ORDER — ALUM & MAG HYDROXIDE-SIMETH 200-200-20 MG/5ML PO SUSP: 30.0000 mL | Freq: Four times a day (QID) | ORAL | Status: DC | PRN

## 2013-06-25 MED ORDER — LACTATED RINGERS IV SOLN
INTRAVENOUS | Status: DC | PRN
  Administered 2013-06-25 (×3): via INTRAVENOUS

## 2013-06-25 MED ORDER — PANTOPRAZOLE SODIUM 40 MG IV SOLR
40.0000 mg | Freq: Every day | INTRAVENOUS | Status: DC
  Administered 2013-06-25: 40 mg via INTRAVENOUS
  Filled 2013-06-25 (×2): qty 40

## 2013-06-25 MED ORDER — SODIUM CHLORIDE 0.9 % IJ SOLN
3.0000 mL | Freq: Two times a day (BID) | INTRAMUSCULAR | Status: DC
  Administered 2013-06-26: 3 mL via INTRAVENOUS

## 2013-06-25 MED ORDER — LIDOCAINE HCL (CARDIAC) 20 MG/ML IV SOLN
INTRAVENOUS | Status: DC | PRN
  Administered 2013-06-25: 40 mg via INTRAVENOUS

## 2013-06-25 MED ORDER — PHENOL 1.4 % MT LIQD: 1.0000 | OROMUCOSAL | Status: DC | PRN

## 2013-06-25 MED ORDER — ROCURONIUM BROMIDE 100 MG/10ML IV SOLN
INTRAVENOUS | Status: DC | PRN
  Administered 2013-06-25: 50 mg via INTRAVENOUS
  Administered 2013-06-25 (×2): 20 mg via INTRAVENOUS
  Administered 2013-06-25: 30 mg via INTRAVENOUS

## 2013-06-25 MED ORDER — METFORMIN HCL 850 MG PO TABS
850.0000 mg | ORAL_TABLET | Freq: Two times a day (BID) | ORAL | Status: DC
  Administered 2013-06-26 – 2013-06-28 (×5): 850 mg via ORAL
  Filled 2013-06-25 (×7): qty 1

## 2013-06-25 MED ORDER — HYDROMORPHONE HCL PF 1 MG/ML IJ SOLN
0.5000 mg | INTRAMUSCULAR | Status: DC | PRN
  Administered 2013-06-25 – 2013-06-26 (×5): 1 mg via INTRAVENOUS
  Filled 2013-06-25 (×5): qty 1

## 2013-06-25 MED ORDER — ACETAMINOPHEN 325 MG PO TABS: 650.0000 mg | ORAL_TABLET | ORAL | Status: DC | PRN

## 2013-06-25 MED ORDER — DOCUSATE SODIUM 100 MG PO CAPS
100.0000 mg | ORAL_CAPSULE | Freq: Two times a day (BID) | ORAL | Status: DC
  Administered 2013-06-25 – 2013-06-28 (×6): 100 mg via ORAL
  Filled 2013-06-25 (×7): qty 1

## 2013-06-25 MED ORDER — AMITRIPTYLINE HCL 25 MG PO TABS
25.0000 mg | ORAL_TABLET | Freq: Every day | ORAL | Status: DC
  Administered 2013-06-25 – 2013-06-27 (×3): 25 mg via ORAL
  Filled 2013-06-25 (×4): qty 1

## 2013-06-25 MED ORDER — THROMBIN 20000 UNITS EX SOLR
CUTANEOUS | Status: AC
  Filled 2013-06-25: qty 20000

## 2013-06-25 MED ORDER — ACETAMINOPHEN 650 MG RE SUPP: 650.0000 mg | RECTAL | Status: DC | PRN

## 2013-06-25 MED ORDER — OXYCODONE HCL 5 MG PO TABS: 5.0000 mg | ORAL_TABLET | Freq: Once | ORAL | Status: DC | PRN

## 2013-06-25 MED ORDER — MENTHOL 3 MG MT LOZG: 1.0000 | LOZENGE | OROMUCOSAL | Status: DC | PRN

## 2013-06-25 SURGICAL SUPPLY — 85 items
ADH SKN CLS APL DERMABOND .7 (GAUZE/BANDAGES/DRESSINGS) ×1
BLADE SURG ROTATE 9660 (MISCELLANEOUS) ×2 IMPLANT
BUR ROUND FLUTED 4 SOFT TCH (BURR) ×2 IMPLANT
CAP SPINAL LOCKING TI (Cap) ×8 IMPLANT
CATH FOLEY 2WAY SLVR  5CC 12FR (CATHETERS) ×1
CATH FOLEY 2WAY SLVR 5CC 12FR (CATHETERS) ×1 IMPLANT
CLOTH BEACON ORANGE TIMEOUT ST (SAFETY) IMPLANT
CORDS BIPOLAR (ELECTRODE) ×2 IMPLANT
COVER MAYO STAND STRL (DRAPES) ×4 IMPLANT
COVER SURGICAL LIGHT HANDLE (MISCELLANEOUS) ×2 IMPLANT
DERMABOND ADVANCED (GAUZE/BANDAGES/DRESSINGS) ×1
DERMABOND ADVANCED .7 DNX12 (GAUZE/BANDAGES/DRESSINGS) ×1 IMPLANT
DRAPE C-ARM 42X72 X-RAY (DRAPES) ×2 IMPLANT
DRAPE MICROSCOPE LEICA (MISCELLANEOUS) ×2 IMPLANT
DRAPE PROXIMA HALF (DRAPES) ×2 IMPLANT
DRAPE SURG 17X23 STRL (DRAPES) ×6 IMPLANT
DRAPE TABLE COVER HEAVY DUTY (DRAPES) ×2 IMPLANT
DRSG EMULSION OIL 3X3 NADH (GAUZE/BANDAGES/DRESSINGS) IMPLANT
DRSG MEPILEX BORDER 4X4 (GAUZE/BANDAGES/DRESSINGS) IMPLANT
DRSG MEPILEX BORDER 4X8 (GAUZE/BANDAGES/DRESSINGS) ×2 IMPLANT
DRSG PAD ABDOMINAL 8X10 ST (GAUZE/BANDAGES/DRESSINGS) IMPLANT
DURAPREP 26ML APPLICATOR (WOUND CARE) ×2 IMPLANT
ELECT BLADE 4.0 EZ CLEAN MEGAD (MISCELLANEOUS) ×2
ELECT CAUTERY BLADE 6.4 (BLADE) ×2 IMPLANT
ELECT REM PT RETURN 9FT ADLT (ELECTROSURGICAL) ×2
ELECTRODE BLDE 4.0 EZ CLN MEGD (MISCELLANEOUS) ×1 IMPLANT
ELECTRODE REM PT RTRN 9FT ADLT (ELECTROSURGICAL) ×1 IMPLANT
EVACUATOR 1/8 PVC DRAIN (DRAIN) ×2 IMPLANT
GLOVE BIO SURGEON STRL SZ 6.5 (GLOVE) ×4 IMPLANT
GLOVE BIO SURGEON STRL SZ7 (GLOVE) ×4 IMPLANT
GLOVE BIOGEL PI IND STRL 6.5 (GLOVE) ×1 IMPLANT
GLOVE BIOGEL PI IND STRL 7.0 (GLOVE) ×2 IMPLANT
GLOVE BIOGEL PI IND STRL 7.5 (GLOVE) ×2 IMPLANT
GLOVE BIOGEL PI IND STRL 8 (GLOVE) ×1 IMPLANT
GLOVE BIOGEL PI INDICATOR 6.5 (GLOVE) ×1
GLOVE BIOGEL PI INDICATOR 7.0 (GLOVE) ×2
GLOVE BIOGEL PI INDICATOR 7.5 (GLOVE) ×2
GLOVE BIOGEL PI INDICATOR 8 (GLOVE) ×1
GLOVE ECLIPSE 7.0 STRL STRAW (GLOVE) ×2 IMPLANT
GLOVE ORTHO TXT STRL SZ7.5 (GLOVE) ×2 IMPLANT
GOWN PREVENTION PLUS LG XLONG (DISPOSABLE) ×2 IMPLANT
GOWN SRG XL XLNG 56XLVL 4 (GOWN DISPOSABLE) ×2 IMPLANT
GOWN STRL NON-REIN LRG LVL3 (GOWN DISPOSABLE) ×4 IMPLANT
GOWN STRL NON-REIN XL XLG LVL4 (GOWN DISPOSABLE) ×2
GOWN STRL REIN 2XL XLG LVL4 (GOWN DISPOSABLE) ×2 IMPLANT
HEMOSTAT SURGICEL 2X14 (HEMOSTASIS) IMPLANT
KIT BASIN OR (CUSTOM PROCEDURE TRAY) ×2 IMPLANT
KIT POSITION SURG JACKSON T1 (MISCELLANEOUS) ×2 IMPLANT
KIT ROOM TURNOVER OR (KITS) ×2 IMPLANT
MANIFOLD NEPTUNE II (INSTRUMENTS) ×2 IMPLANT
MARKER SKIN DUAL TIP RULER LAB (MISCELLANEOUS) ×2 IMPLANT
NDL SUT .5 MAYO 1.404X.05X (NEEDLE) IMPLANT
NEEDLE MAYO TAPER (NEEDLE)
NS IRRIG 1000ML POUR BTL (IV SOLUTION) ×2 IMPLANT
PACK LAMINECTOMY ORTHO (CUSTOM PROCEDURE TRAY) ×2 IMPLANT
PAD ARMBOARD 7.5X6 YLW CONV (MISCELLANEOUS) ×4 IMPLANT
PATTIES SURGICAL .5 X.5 (GAUZE/BANDAGES/DRESSINGS) IMPLANT
PATTIES SURGICAL .75X.75 (GAUZE/BANDAGES/DRESSINGS) ×2 IMPLANT
ROD 45MM (Rod) ×2 IMPLANT
ROD SPNL CVD 45X5.5XHRD NS (Rod) ×2 IMPLANT
SCREW MATRIX MIS 6.0X40MM (Screw) ×4 IMPLANT
SCREW MATRIX MIS 6.0X45MM (Screw) ×4 IMPLANT
SPACER CONCORDE CUR 5DEG L11MM (Orthopedic Implant) ×2 IMPLANT
SPONGE LAP 18X18 X RAY DECT (DISPOSABLE) ×2 IMPLANT
SPONGE LAP 4X18 X RAY DECT (DISPOSABLE) ×2 IMPLANT
SPONGE SURGIFOAM ABS GEL 100 (HEMOSTASIS) ×2 IMPLANT
STAPLER VISISTAT 35W (STAPLE) IMPLANT
SURGIFLO TRUKIT (HEMOSTASIS) IMPLANT
SUT BONE WAX W31G (SUTURE) ×2 IMPLANT
SUT VIC AB 0 CT1 27 (SUTURE) ×1
SUT VIC AB 0 CT1 27XBRD ANBCTR (SUTURE) ×1 IMPLANT
SUT VIC AB 1 CT1 27 (SUTURE) ×1
SUT VIC AB 1 CT1 27XBRD ANBCTR (SUTURE) ×1 IMPLANT
SUT VIC AB 2-0 CT1 27 (SUTURE) ×4
SUT VIC AB 2-0 CT1 TAPERPNT 27 (SUTURE) ×2 IMPLANT
SUT VICRYL 0 TIES 12 18 (SUTURE) IMPLANT
SUT VICRYL 4-0 PS2 18IN ABS (SUTURE) ×2 IMPLANT
SUT VICRYL AB 2 0 TIES (SUTURE) IMPLANT
SYR CONTROL 10ML LL (SYRINGE) ×2 IMPLANT
TOWEL OR 17X24 6PK STRL BLUE (TOWEL DISPOSABLE) ×2 IMPLANT
TOWEL OR 17X26 10 PK STRL BLUE (TOWEL DISPOSABLE) ×2 IMPLANT
TRAY FOLEY CATH 14FR (SET/KITS/TRAYS/PACK) ×2 IMPLANT
TRAY FOLEY CATH 16FRSI W/METER (SET/KITS/TRAYS/PACK) IMPLANT
WATER STERILE IRR 1000ML POUR (IV SOLUTION) IMPLANT
YANKAUER SUCT BULB TIP NO VENT (SUCTIONS) ×2 IMPLANT

## 2013-06-25 NOTE — Anesthesia Preprocedure Evaluation (Addendum)
Anesthesia Evaluation  Patient identified by MRN, date of birth, ID band Patient awake    Reviewed: Allergy & Precautions, H&P , NPO status , Patient's Chart, lab work & pertinent test results  Airway Mallampati: II TM Distance: >3 FB Neck ROM: Full    Dental no notable dental hx. (+) Teeth Intact and Dental Advisory Given   Pulmonary neg pulmonary ROS, Current Smoker,  breath sounds clear to auscultation  Pulmonary exam normal       Cardiovascular negative cardio ROS  Rhythm:Regular Rate:Normal     Neuro/Psych Depression negative neurological ROS     GI/Hepatic negative GI ROS, Neg liver ROS,   Endo/Other  diabetes, Type 2, Oral Hypoglycemic Agents  Renal/GU negative Renal ROS  negative genitourinary   Musculoskeletal   Abdominal   Peds  Hematology negative hematology ROS (+)   Anesthesia Other Findings   Reproductive/Obstetrics negative OB ROS                          Anesthesia Physical Anesthesia Plan  ASA: II  Anesthesia Plan: General   Post-op Pain Management:    Induction: Intravenous  Airway Management Planned: Oral ETT  Additional Equipment:   Intra-op Plan:   Post-operative Plan: Extubation in OR  Informed Consent: I have reviewed the patients History and Physical, chart, labs and discussed the procedure including the risks, benefits and alternatives for the proposed anesthesia with the patient or authorized representative who has indicated his/her understanding and acceptance.   Dental advisory given  Plan Discussed with: CRNA  Anesthesia Plan Comments:         Anesthesia Quick Evaluation

## 2013-06-25 NOTE — Progress Notes (Signed)
Orthopedic Tech Progress Note Patient Details:  Herbert Meyer Jul 18, 1971 [id number] Talked to bio-tech brace will be fitted 06/26/13 first thing in morning.   Patient ID: Herbert Meyer, male   DOB: June 12, 1971, 42 y.o.   MRN: [id number]   Herbert Meyer 06/25/2013, 6:49 PM

## 2013-06-25 NOTE — Anesthesia Postprocedure Evaluation (Signed)
  Anesthesia Post-op Note  Patient: Herbert Meyer  Procedure(s) Performed: Procedure(s) with comments: POSTERIOR LUMBAR FUSION 1 LEVEL (Left) - Left L4-5 TLIF, L3-4 Decompression, Bilateral L4-5 Fusion, Pedicle Instrumentation, PEEK Cage  Patient Location: PACU  Anesthesia Type:General  Level of Consciousness: awake and alert   Airway and Oxygen Therapy: Patient Spontanous Breathing  Post-op Pain: mild  Post-op Assessment: Post-op Vital signs reviewed  Post-op Vital Signs: stable  Complications: No apparent anesthesia complications

## 2013-06-25 NOTE — Interval H&P Note (Signed)
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 After consideration of risks, benefits and other options for treatment, the patient has consented to  Procedure(s) with comments: POSTERIOR LUMBAR FUSION 1 LEVEL (Left) - Left L4-5 TLIF, L3-4 Decompression, Bilateral L4-5 Fusion, Pedicle Instrumentation, PEEK Cage as a surgical intervention .  The patient's history has been reviewed, patient examined, no change in status, stable for surgery.  I have reviewed the patient's chart and labs.  Questions were answered to the patient's satisfaction.     [name] C

## 2013-06-25 NOTE — Brief Op Note (Signed)
06/25/2013  4:12 PM  PATIENT:  Herbert Meyer  42 y.o. 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, Stenosis  PROCEDURE:  Procedure(s) with comments: POSTERIOR LUMBAR FUSION 1 LEVEL (Left) - Left L4-5 TLIF, L3-4 Decompression, Bilateral L4-5 Fusion, Pedicle Instrumentation, PEEK Cage  SURGEON:  Surgeon(s) and Role:    * Eldred Manges, MD - Primary  PHYSICIAN ASSISTANT: Maud Deed [facility]  ASSISTANTS: none   ANESTHESIA:   general  EBL:  Total I/O In: 2000 [I.V.:2000] Out: 750 [Urine:450; Blood:300]  BLOOD ADMINISTERED:none  DRAINS: none   LOCAL MEDICATIONS USED:  NONE  SPECIMEN:  No Specimen  DISPOSITION OF SPECIMEN:  N/A  COUNTS:  YES  TOURNIQUET:  * No tourniquets in log *  DICTATION: .Note written in EPIC  PLAN OF CARE: Admit to inpatient   PATIENT DISPOSITION:  PACU - hemodynamically stable.   Delay start of Pharmacological VTE agent (>24hrs) due to surgical blood loss or risk of bleeding: yes

## 2013-06-25 NOTE — H&P (Signed)
Herbert Meyer is an 42 y.o. male.   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  Due to his persistent symptoms that occurred postoperatively, MRI scan 03/15/2013 was performed which showed degenerative subluxation at L3-4 with moderate stenosis at that level and moderate stenosis at L4-5 due to large recurrent disk protrusion with significant compression at that level.  He had enhanced left L4 nerve roots extending into an area with epidural fibrosis and subarticular recess stenosis on the left which corresponds with his symptoms.   ESI and narcotic analgesics are not relieving his pain.  He has undergone PT and had and FCE. Pt now wishes to proceed with surgical intervention and will proceed with decompression of L3-4 and left L4-5 TLIF with pedicle screw and rod fixation, interbody cage and local bone graft.  Past Medical History  Diagnosis Date  . Depression   . Kidney stones     passed  . Blood in stool   . Diabetes mellitus without complication     dx 2012    Past Surgical History  Procedure Laterality Date  . Umbilical hernia repair    . Boil Right     exicision  . Finger nail surgery  1990's    implant on left little finger  . Lumbar laminectomy N/A 01/10/2013    Procedure: MICRODISCECTOMY LUMBAR LAMINECTOMY;  Surgeon: Eldred Manges, MD;  Location: [facility] OR;  Service: Orthopedics;  Laterality: N/A;  Bilateral Lumbar 4-5 Microdiscectomy  . Back surgery      History reviewed. No pertinent family history. Social History:  reports that he has been smoking Cigarettes.  He has a 13.5 pack-year smoking history. He does not have any smokeless tobacco history on file. He reports that he drinks alcohol. He reports that he does not use illicit  drugs.  Allergies:  Allergies  Allergen Reactions  . Prednisone     Pt. Reports that gets easily upset," temper flares with steroids"      Medications Prior to Admission  Medication Sig Dispense Refill  . amitriptyline (ELAVIL) 25 MG tablet Take 25 mg by mouth at bedtime.      . Canagliflozin (INVOKANA) 100 MG TABS Take 100 mg by mouth daily.      . carbamazepine (TEGRETOL XR) 200 MG 12 hr tablet Take 200 mg by mouth 2 (two) times daily.      Marland Kitchen lamoTRIgine (LAMICTAL) 100 MG tablet Take 200 mg by mouth 2 (two) times daily.      . meloxicam (MOBIC) 15 MG tablet Take 15 mg by mouth daily.      . metFORMIN (GLUCOPHAGE) 850 MG tablet Take 850 mg by mouth 2 (two) times daily with a meal.      . methocarbamol (ROBAXIN) 500 MG tablet Take 1 tablet (500 mg total) by mouth every 6 (six) hours as needed (spasm).  30 tablet  0  . oxyCODONE-acetaminophen (ROXICET) 5-325 MG per tablet Take 1-2 tablets by mouth every 4 (four) hours as needed for pain.  60 tablet  0    No results found for this or any previous visit (from the past 48 hour(s)). No results found.  Review of Systems  Constitutional: Negative.   HENT: Negative.   Eyes: Negative.   Respiratory: Negative.   Cardiovascular: Negative.  Gastrointestinal: Negative.   Genitourinary: Negative.   Musculoskeletal: Positive for back pain and myalgias.       Difficulty sleeping secondary to nerve pain in his legs.  Skin: Negative.     Blood pressure 100/88, pulse 88, temperature 97.2 F (36.2 C), temperature source Oral, resp. rate 20, SpO2 97.00%. Physical Exam  Constitutional: He is oriented to person, place, and time. He appears well-developed and well-nourished.  HENT:  Head: Normocephalic and atraumatic.  Eyes: EOM are normal. Pupils are equal, round, and reactive to light.  Neck: Normal range of motion. Neck supple.  Cardiovascular: Normal rate and intact distal pulses.   Respiratory: Effort normal.  GI: Soft.   Musculoskeletal:  + SLR at 70 degrees bilateral Anterior tib and EHL strength intact bilateral.  Neurological: He is alert and oriented to person, place, and time.  Skin: Skin is warm and dry.  Psychiatric: He has a normal mood and affect.     Assessment/Plan Central stenosis L3-4  Recurrent HNP L4-5 stenosis  PLAN:  Decompression L3-4 and TLIF L4-5 with pedicle screws and rods, cage and local bone graft.   Herbert Meyer M 06/25/2013, 10:44 AM

## 2013-06-25 NOTE — Transfer of Care (Signed)
Immediate Anesthesia Transfer of Care Note  Patient: Herbert Meyer  Procedure(s) Performed: Procedure(s) with comments: POSTERIOR LUMBAR FUSION 1 LEVEL (Left) - Left L4-5 TLIF, L3-4 Decompression, Bilateral L4-5 Fusion, Pedicle Instrumentation, PEEK Cage  Patient Location: PACU  Anesthesia Type:General  Level of Consciousness: awake, alert , oriented and patient cooperative  Airway & Oxygen Therapy: Patient Spontanous Breathing and Patient connected to nasal cannula oxygen  Post-op Assessment: Report given to PACU RN, Post -op Vital signs reviewed and stable and Patient moving all extremities  Post vital signs: Reviewed and stable  Complications: No apparent anesthesia complications

## 2013-06-26 LAB — GLUCOSE, CAPILLARY
Glucose-Capillary: 117 mg/dL — ABNORMAL HIGH (ref 70–99)
Glucose-Capillary: 120 mg/dL — ABNORMAL HIGH (ref 70–99)
Glucose-Capillary: 124 mg/dL — ABNORMAL HIGH (ref 70–99)
Glucose-Capillary: 118 mg/dL — ABNORMAL HIGH (ref 70–99)

## 2013-06-26 LAB — CBC
HCT: 34.1 % — ABNORMAL LOW (ref 39.0–52.0)
MCH: 31.4 pg (ref 26.0–34.0)
MCHC: 34 g/dL (ref 30.0–36.0)
MCV: 92.2 fL (ref 78.0–100.0)
Platelets: 229 10*3/uL (ref 150–400)
RDW: 12.9 % (ref 11.5–15.5)

## 2013-06-26 LAB — BASIC METABOLIC PANEL
BUN: 15 mg/dL (ref 6–23)
Calcium: 8.3 mg/dL — ABNORMAL LOW (ref 8.4–10.5)
Creatinine, Ser: 0.81 mg/dL (ref 0.50–1.35)
GFR calc Af Amer: >90 mL/min (ref >90)
GFR calc non Af Amer: >90 mL/min (ref >90)
Potassium: 3.9 mEq/L (ref 3.5–5.1)

## 2013-06-26 MED ORDER — PANTOPRAZOLE SODIUM 40 MG PO TBEC
40.0000 mg | DELAYED_RELEASE_TABLET | Freq: Every day | ORAL | Status: DC
  Administered 2013-06-26 – 2013-06-27 (×2): 40 mg via ORAL
  Filled 2013-06-26 (×2): qty 1

## 2013-06-26 MED ORDER — WHITE PETROLATUM GEL
Status: AC
  Administered 2013-06-26: 22:00:00
  Filled 2013-06-26: qty 5

## 2013-06-26 MED FILL — Sodium Chloride IV Soln 0.9%: INTRAVENOUS | Qty: 1000 | Status: AC

## 2013-06-26 MED FILL — Heparin Sodium (Porcine) Inj 1000 Unit/ML: INTRAMUSCULAR | Qty: 30 | Status: AC

## 2013-06-26 NOTE — Progress Notes (Signed)
06/26/13 Received voicemail from [employer] Zella Ball Stockner [phone] requesting call back with d/c needs. Spoke with patient, he stated that he has a rolling walker but will need a new cane. Contacted Zella Ball, she will get patient a cane, will need order faxed to [phone]. Faxed order, received confirmation. Will continue to follow for d/c needs. Jacquelynn Cree RN, BSN, CCM

## 2013-06-26 NOTE — Progress Notes (Signed)
Subjective: 1 Day Post-Op Procedure(s) (LRB): POSTERIOR LUMBAR FUSION 1 LEVEL (Left) Patient reports pain as moderate.    Objective: Vital signs in last 24 hours: Temp:  [97.2 F (36.2 C)-98.5 F (36.9 C)] 98.5 F (36.9 C) (10/28 0554) Pulse Rate:  [76-107] 76 (10/28 0554) Resp:  [11-20] 18 (10/28 0554) BP: (92-122)/(55-88) 99/71 mmHg (10/28 0554) SpO2:  [97 %-100 %] 100 % (10/28 0554)  Intake/Output from previous day: 10/27 0701 - 10/28 0700 In: 2300 [I.V.:2300] Out: 2550 [Urine:2250; Blood:300] Intake/Output this shift: Total I/O In: 963.8 [I.V.:863.8; IV Piggyback:100] Out: -    Recent Labs  06/26/13 0525  HGB 11.6*    Recent Labs  06/26/13 0525  WBC 9.6  RBC 3.70*  HCT 34.1*  PLT 229    Recent Labs  06/26/13 0525  NA 138  K 3.9  CL 102  CO2 26  BUN 15  CREATININE 0.81  GLUCOSE 110*  CALCIUM 8.3*   No results found for this basename: LABPT, INR,  in the last 72 hours  Neurologically intact  Assessment/Plan: 1 Day Post-Op Procedure(s) (LRB): POSTERIOR LUMBAR FUSION 1 LEVEL (Left) Up with therapy  Brace this AM. Ambulation with PT,   Walker if needed.  ? Home Wed AM  Rodd Heft C 06/26/2013, 7:51 AM

## 2013-06-26 NOTE — Evaluation (Signed)
Occupational Therapy Evaluation & Treatment Note Patient Details Name: Herbert Meyer MRN: [id number] DOB: 1971/04/03 Today's Date: 06/26/2013 Time: 1410-1450 OT Time Calculation (min): 40 min  OT Assessment / Plan / Recommendation History of present illness 42yo M s/p lumbar fusion    Clinical Impression   Pt is 42 y/o M s/p lumbar fusion (2nd back surgery). Pt w/significant pain but willing to work w/OT. PT states he needs to be (I) as wife needs to return to work and 2 sons are in school. Pt agreeable for OOB activity: Supervision using rails for log roll technique for sup>sit. Good sitting balance. Introduced AE for LB ADLs due to back precautions. Pt w/good return demo and verbalized understanding. Pt concerned about payment for AE as he believes workman's comp case may pay for it. Attempted regular commode transfer; however, pt unable to perform w/o 3n1 over commode. Rec 3n1 upon d/c and AE for LB ADLs.     OT Assessment  Patient needs continued OT Services    Follow Up Recommendations     Barriers to Discharge      Equipment Recommendations  3 in 1 bedside comode (and AE for LB ADLs due to back precautions)    Recommendations for Other Services    Frequency  Min 2X/week    Precautions / Restrictions Precautions Precautions: Back Required Braces or Orthoses: Spinal Brace Spinal Brace: Applied in sitting position Restrictions Weight Bearing Restrictions: No   Pertinent Vitals/Pain 7-8/10. Pt states he recently received pain meds.    ADL  Lower Body Dressing: Min guard Where Assessed - Lower Body Dressing: Unsupported sitting Toilet Transfer: Performed;Min guard Toilet Transfer Method: Stand pivot Toilet Transfer Equipment: Raised toilet seat with arms (or 3-in-1 over toilet) (attempted w/o 3n1; pt u/a)    OT Diagnosis: Generalized weakness;Acute pain  OT Problem List: Decreased strength;Decreased activity tolerance;Decreased knowledge of use of DME or AE OT  Treatment Interventions: Self-care/ADL training;DME and/or AE instruction;Therapeutic activities;Patient/family education   OT Goals(Current goals can be found in the care plan section) Acute Rehab OT Goals Patient Stated Goal: Return home (I); decrease pain OT Goal Formulation: With patient Time For Goal Achievement: 06/29/13 Potential to Achieve Goals: Good ADL Goals Pt Will Perform Lower Body Bathing: with modified independence Pt Will Transfer to Toilet: with modified independence Pt Will Perform Toileting - Clothing Manipulation and hygiene: with modified independence  Visit Information  Last OT Received On: 06/26/13 Assistance Needed: +1 History of Present Illness: 42yo M s/p lumbar fusion        Prior Functioning     Home Living Family/patient expects to be discharged to:: Private residence Living Arrangements: Spouse/significant other;Children Available Help at Discharge: Family Type of Home: House Home Access: Stairs to enter Secretary/administrator of Steps: 6 Entrance Stairs-Rails: Can reach both;Left;Right Home Layout: One level Home Equipment: Environmental consultant - 2 wheels;Cane - single point Prior Function Level of Independence: Independent with assistive device(s) Communication Communication: No difficulties         Vision/Perception     Cognition  Cognition Arousal/Alertness: Awake/alert Behavior During Therapy: WFL for tasks assessed/performed Overall Cognitive Status: Within Functional Limits for tasks assessed    Extremity/Trunk Assessment Upper Extremity Assessment Upper Extremity Assessment: Overall WFL for tasks assessed Lower Extremity Assessment Lower Extremity Assessment: Overall WFL for tasks assessed     Mobility Bed Mobility Bed Mobility: Rolling Left;Supine to Sit Rolling Left: 5: Supervision Left Sidelying to Sit: 5: Supervision;With rails Transfers Sit to Stand: 4: Min guard Stand to Sit:  4: Min guard Details for Transfer Assistance:  cues for transition position, use of UEs to self assist and adherence to back precautions     Exercise     Balance     End of Session OT - End of Session Equipment Utilized During Treatment: Rolling walker Activity Tolerance: Patient limited by pain Patient left: in chair  GO     Daud Cayer, Deidre Ala 06/26/2013, 3:50 PM

## 2013-06-26 NOTE — Evaluation (Addendum)
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 Due to his persistent symptoms that occurred postoperatively, MRI scan 03/15/2013 was performed which showed degenerative subluxation at L3-4 with moderate stenosis at that level and moderate stenosis at L4-5 due to large recurrent disk protrusion with significant compression at that level. He had enhanced left L4 nerve roots extending into an area with epidural fibrosis and subarticular recess stenosis on the left which corresponds with his symptoms.  ESI and narcotic analgesics are not relieving his pain. He has undergone PT and had and FCE.  Pt now wishes to proceed with surgical intervention and will proceed with decompression of L3-4 and left L4-5 TLIF with pedicle screw and rod fixation, interbody cage and local bone graft   Clinical Impression  Pt s/p lumbar decompression at L3-4 and L 4-5 with fusion at L 4-5 presents with functional mobility limitations 2* post op pain, back precautions and need for lumbar brace.  Pt should progress to d/c home with family assist.  Pt educated on don/doff brace in sitting and participating with application.    PT Assessment  Patient needs continued PT services    Follow Up Recommendations  No PT follow up    Does the patient have the potential to tolerate intense rehabilitation      Barriers to Discharge        Equipment Recommendations  None recommended by PT    Recommendations for Other Services OT consult   Frequency Min 5X/week    Precautions / Restrictions  Precautions Precautions: Back Precaution Booklet Issued: Yes (comment) Precaution Comments: All precautions reviewed with pt several times Required Braces or Orthoses: Spinal Brace Spinal Brace: Applied in sitting position Restrictions Weight Bearing Restrictions: No   Pertinent Vitals/Pain 5/10; premed      Mobility  Bed Mobility Bed Mobility: Rolling Left;Supine to Sit;Left Sidelying to Sit Rolling Left: 4: Min guard Left Sidelying to Sit: 4: Min guard Supine to Sit: 4: Min guard Details for Bed Mobility Assistance: cues for correct log roll technique Transfers Transfers: Sit to Stand;Stand to Sit Sit to Stand: 4: Min guard Stand to Sit: 4: Min guard Details for Transfer Assistance: cues for transition position, use of UEs to self assist and adherence to back precautions Ambulation/Gait Ambulation/Gait Assistance: 4: Min guard;5: Supervision Ambulation Distance (Feet): 500 Feet Assistive device: Rolling walker Ambulation/Gait Assistance Details: cues for pace, posture and position from RW Gait Pattern: Step-through pattern;Decreased stride length;Shuffle Stairs: No    Exercises General Exercises - Lower Extremity Ankle Circles/Pumps: AROM;20 reps;Supine;Both   PT Diagnosis: Difficulty walking  PT Problem List: Decreased activity tolerance;Decreased strength;Decreased mobility;Decreased knowledge of use of DME;Decreased knowledge of precautions;Pain PT Treatment Interventions: DME instruction;Gait training;Stair training;Functional mobility training;Therapeutic activities;Therapeutic exercise;Patient/family education     PT Goals(Current goals can be found in the care plan section) Acute Rehab PT Goals Patient Stated Goal: Not have to have another back surgery PT Goal Formulation: With patient Time For Goal Achievement: 07/03/13 Potential to Achieve Goals: Good  Visit Information  Last PT Received On: 06/26/13 Assistance Needed: +1       Prior Functioning  Home  Living  Family/patient expects to be discharged to:: Private residence Living Arrangements: Spouse/significant other;Children Available Help at Discharge: Family Type of Home: House Home Access: Stairs to enter Secretary/administrator of Steps: 6 Entrance Stairs-Rails: Right;Left;Can reach both Home Layout: One level Home Equipment: Environmental consultant - 2 wheels Prior Function Level of Independence: Independent with assistive device(s) Communication Communication: No difficulties    Cognition  Cognition Arousal/Alertness: Awake/alert Behavior During Therapy: WFL for tasks assessed/performed Overall Cognitive Status: Within Functional Limits for tasks assessed    Extremity/Trunk Assessment Upper Extremity Assessment Upper Extremity Assessment: Overall WFL for tasks assessed Lower Extremity Assessment Lower Extremity Assessment: Overall WFL for tasks assessed   Balance    End of Session PT - End of Session Equipment Utilized During Treatment: Back brace Activity Tolerance: Patient tolerated treatment well Patient left: in chair;with call bell/phone within reach;with family/visitor present Nurse Communication: Mobility status  GP     Ellagrace Yoshida 06/26/2013, 12:44 PM

## 2013-06-26 NOTE — Progress Notes (Signed)
UR COMPLETED  

## 2013-06-26 NOTE — Op Note (Signed)
NAMEBREION, Herbert Meyer              ACCOUNT NO.:  [id number]  MEDICAL RECORD NO.:  [id number]  LOCATION:  [id number]                        FACILITY:  [facility]  PHYSICIAN:  Kelvyn Schunk C. Ophelia Charter, M.D.    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 L3-4 lateral recess stenosis. L3-4 central stenosis.  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  L3-4 lateral recess stenosis. L3-4 central stenosis.  PROCEDURE:  Microscope-assisted L4-5 microdiskectomy for recurrent herniated nucleus pulposus.  Decompression L4-5 and L3-4 with left L4-5 interbody fusion.  Bilateral L4-L5 lateral gutter fusion with pedicle instrumentation.  Synthes matrix screws 45 mm rods DePuy Concord curved 11 mm height cage 5-degree lordotic.  Local interbody bone and local bone in the cage.  6 x 45 screws on the left and 6 x 40 mm screws on the right.  SURGEON:  Quint Chestnut C. Ophelia Charter, M.D.  ASSISTANT:  Wende Neighbors, PA-C, medically necessary and present for the entire procedure.  EBL: 1. Cell Saver not enough for re-transfusion.  COMPLICATIONS:  None.  INDICATIONS:  This is a 42 year old male who already had some congenitally short pedicles with large disk herniation after on-the-job injury.  Had microdiskectomy done back in April.  He did well for a period of approximately a couple months and then started having increased pain and MRI scan showed large recurrent disk herniation.  The patient had some degenerative subluxation at L3-4 with moderate central stenosis and lateral recess stenosis.  L4-5 showed large recurrent disk rupture with significant compression.  The patient then had laminotomy done both left and right.  Previous procedure to give some extra room, but  the massive disk herniation and central stenosis and he was having claudication symptoms as well as radicular pain, more left than right.  PROCEDURE:  After induction of general anesthesia, orotracheal intubation, Foley catheter placement.  Initially Foley catheter was unable to be passed.  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  There was good backflow clear without blood.  The patient placed prone on the spine frame, careful pads over the iliac crest, thigh, chest roll, chest roll had to be positioned.  Pads were placed underneath the shoulders, forearms, ulnar nerve, knee pads, and pillows underneath the ankles.  Back was prepped with DuraPrep, 2 g Ancef were given at 1:10 and then repeated at 4:10.  After the back was prepped, area squared with towels, and sterile skin marker was used and laminectomy sheets and drapes. Time-out procedure was completed.  Sterile skin marker had been used on the old incision, where he had previous microdiskectomy and was extended proximally and distally for a planned exposure.  Betadine and Steri- Drape was applied.  Laminectomy sheets and drapes.  Midline incision was made.  Subperiosteal dissection on the lamina.  The inferior aspect of spinous process at L4 that had been removed previously was identified  and Kocher clamps were placed at the top to the bottom of planned areas of decompression and C-arm was brought in, draped, and used to confirm appropriate level with planned removal of the inferior lamina of L3 since the patient had moderate stenosis at the level of the disk with lateral recess stenosis, but without significant stenosis above that level.  It was felt that the top portion of the lamina could be left. Remaining portion of L4 lamina that was left as well as the top 1/3rd of the L5 lamina was taken on the left  side, and all of L4 hemilaminectomy on the right as well as L3 on the right up to leaving the upper half of the right L3 lamina in line with the left side.  The spinous processes and lamina were meticulously cleaned of soft tissue for use of bone graft, small grape nut sized pieces.  The operative microscope was draped and brought in.  Thick chunks of ligament were removed.  The patient had hypertrophic ligaments, overhanging spurs.  There was large disk herniation on the left at L4-5 causing significant compression.  Chunks of disk were removed.  Penfield 4 was placed in the L4-5 disk space, confirmed with cross-table lateral C-arm, this was the appropriate level.  Cutters, scrapers, angled curettes, ring curettes, rasps were used for preparations of the endplate.  There was a wide disk space at this level.  Despite the patient's stenosis, recurrent disk herniation, and progression up to 11 mm trials, which gave a nice tight fit and restoration of the disk space height.  Endplates were curetted.  The bone graft was meticulously packed in the space with D'Errico used for protection.  Nerve roots carefully protected.  Some epidural veins were coagulated with the bipolar cautery.  The bone was removed, removing the left L4-5 facet in line with the disk space so that the cage could be impacted into place, checked under fluoroscopy, and then kicked over until it was almost perfect on x-ray.  It was centrally positioned and the left side of the cage was 2 mm more than right was countersunk 3 mm inside the disk space with copious amount of bone graft packed anterior to the cage as well as the patient's bone packed inside the cage.  Cage was secured and it was felt that it was impacted further, it was possible that the cage might break with the impactor and hammer.  Next, pedicle screws were placed on the left side.  L5 pedicle screw first and both screws in the left were 45 mm.  Processes were  using the awl filling the pedicle inside the canal.  Using the C-arm to confirm it, then using the joystick to hand progressed down the pedicle checking C-arm using the pedicle feeler followed by tap followed by the pedicle feeler followed by decortication of the transverse process and then placement of the pedicle screw. Synthes Matrix screws were placed.  Identical procedure was repeated for the right screws, checked on fluoroscopy, 45 mm rods were placed.  Screw heads were placed, locked down, and the cage inserts side on the left was compressed first followed by compression on the right side, maximally until the screws clicked with 70 pounds of torque pressure. After irrigation with saline solution, the thrombin-soaked Gelfoam was removed off the cage, nerve root was inspected, dura was intact. Lateral gutters were run to make sure there were no loose pieces of bone.  There were no spikes in either gutters.  The  disk on the right side at 4=5 was not protruding and there has been satisfactory decompression both at 3-4 at the level of the disk, which could be palpated on both sides, as well as talar side of 4-5 both right and left.  Dura was round, copious irrigation and standard layered closure with #1 Vicryl, 2-0 Vicryl, and subcuticular closure and Dermabond and postop dressing.  Instrument count and needle count was correct.  The patient tolerated the procedure well and was transferred to the recovery room in a stable condition.     Korie Streat C. Ophelia Charter, M.D.     MCY/MEDQ  D:  06/25/2013  T:  06/26/2013  Job:  [id number]

## 2013-06-27 LAB — GLUCOSE, CAPILLARY
Glucose-Capillary: 147 mg/dL — ABNORMAL HIGH (ref 70–99)
Glucose-Capillary: 96 mg/dL (ref 70–99)
Glucose-Capillary: 130 mg/dL — ABNORMAL HIGH (ref 70–99)

## 2013-06-27 MED ORDER — OXYCODONE-ACETAMINOPHEN 5-325 MG PO TABS: 1.0000 | ORAL_TABLET | ORAL | Status: DC | PRN

## 2013-06-27 MED ORDER — METHOCARBAMOL 500 MG PO TABS: 500.0000 mg | ORAL_TABLET | Freq: Four times a day (QID) | ORAL | Status: DC | PRN

## 2013-06-27 MED ORDER — FLEET ENEMA 7-19 GM/118ML RE ENEM
1.0000 | ENEMA | Freq: Every day | RECTAL | Status: DC | PRN
  Administered 2013-06-27: 1 via RECTAL
  Filled 2013-06-27: qty 1

## 2013-06-27 MED ORDER — DSS 100 MG PO CAPS: 100.0000 mg | ORAL_CAPSULE | Freq: Two times a day (BID) | ORAL | Status: DC

## 2013-06-27 MED ORDER — BISACODYL 10 MG RE SUPP
10.0000 mg | Freq: Once | RECTAL | Status: AC
  Administered 2013-06-27: 10 mg via RECTAL
  Filled 2013-06-27: qty 1

## 2013-06-27 NOTE — Progress Notes (Signed)
Occupational Therapy Treatment Patient Details Name: Herbert Meyer MRN: [id number] DOB: 1970-12-16 Today's Date: 06/27/2013 Time: 4098-1191 OT Time Calculation (min): 24 min  OT Assessment / Plan / Recommendation       OT comments  Pt. Awake and agreeable to participation in skilled ot.  Bed mobility mod I.  All aspects of toielting s.  Able to state and demonstrate 3/3 precautions and don brace with set up.  Doing well this a.m.  Follow Up Recommendations       Barriers to Discharge       Equipment Recommendations  3 in 1 bedside comode;Other (comment) (a/e kit) pt. Has concerns regarding workmans comp coverage and delivery of these items as he reports issues in the past with getting needed equipment in a timely manner.         Frequency Min 2X/week   Progress towards OT Goals Progress towards OT goals: Progressing toward goals  Plan Discharge plan remains appropriate    Precautions / Restrictions Precautions Precautions: Back Precaution Comments: pt. able to state 3/3 precautions Required Braces or Orthoses: Spinal Brace Spinal Brace: Applied in sitting position   Pertinent Vitals/Pain 4/10, meds given prior to arrival    ADL  Upper Body Dressing: Performed;Set up Toilet Transfer: Performed;Supervision/safety Toilet Transfer Method: Stand Wellsite geologist: Raised toilet seat with arms (or 3-in-1 over toilet) Toileting - Clothing Manipulation and Hygiene: Performed;Supervision/safety Where Assessed - Engineer, mining and Hygiene: Lean right and/or left Transfers/Ambulation Related to ADLs: amb. to/from b.room with rw, pt. moves slow but demonstrates good walker safety ADL Comments: has questions for workmans comp coverage for needed a/e and 3n1, left note for rn case manager to assist pt. with this.  able to don brace with set up.  pts wife present and concerned with pt. being home alone for approx. 10-12 hours each day.  reviewed pt. would  be traveling from bed/chair/b.room and able to heat meals in microwave.  pt. states he will be fine but wife is concerned.  states thier children can check on him early afternoon if needed.      OT Goals(current goals can now be found in the care plan section)    Visit Information  Last OT Received On: 06/27/13    Subjective Data   "i have been in pain so long that i am used to moving and doing things in pain"          Cognition  Cognition Arousal/Alertness: Awake/alert Behavior During Therapy: WFL for tasks assessed/performed Overall Cognitive Status: Within Functional Limits for tasks assessed    Mobility  Bed Mobility Details for Bed Mobility Assistance: mod i for bed mobility using log roll tech. with hob flat and no rails Transfers Transfers: Sit to Stand;Stand to Sit Sit to Stand: 5: Supervision;From bed Stand to Sit: 5: Supervision;To chair/3-in-1               End of Session OT - End of Session Equipment Utilized During Treatment: Rolling walker Activity Tolerance: Patient tolerated treatment well Patient left: in chair;with call bell/phone within reach;with family/visitor present Nurse Communication: Other (comment) (discussed pts. reported concerns from previous night ie; brace and bed mobility with 3rd shift)       [name], COTA/L 06/27/2013, 7:46 AM

## 2013-06-27 NOTE — Progress Notes (Signed)
Subjective: 2 Days Post-Op Procedure(s) (LRB): POSTERIOR LUMBAR FUSION 1 LEVEL (Left) Patient reports pain as moderate.  Chills last night  Better today.  Using IS.  No BM  + flatus and eating without nausea. Voiding well. Pain controlled.   Out of bed with PT  Yesterday Complains that nursing at night do not answer his call and when they do it takes hours before they come.  Advised him that it is ok for his wife to get him water, etc in the absence of a nurse. Multiple stairs at home to get into house.  Will work with PT    Objective: Vital signs in last 24 hours: Temp:  [98.8 F (37.1 C)-99.6 F (37.6 C)] 99.3 F (37.4 C) (10/29 0543) Pulse Rate:  [97-110] 110 (10/29 0543) Resp:  [18] 18 (10/29 0543) BP: (104-115)/(60-69) 108/69 mmHg (10/29 0543) SpO2:  [95 %-98 %] 98 % (10/29 0543)  Intake/Output from previous day: 10/28 0701 - 10/29 0700 In: 1683.8 [P.O.:720; I.V.:863.8; IV Piggyback:100] Out: 1600 [Urine:1600] Intake/Output this shift:     Recent Labs  06/26/13 0525  HGB 11.6*    Recent Labs  06/26/13 0525  WBC 9.6  RBC 3.70*  HCT 34.1*  PLT 229    Recent Labs  06/26/13 0525  NA 138  K 3.9  CL 102  CO2 26  BUN 15  CREATININE 0.81  GLUCOSE 110*  CALCIUM 8.3*   No results found for this basename: LABPT, INR,  in the last 72 hours  ABD soft Neurovascular intact Sensation intact distally Intact pulses distally Dorsiflexion/Plantar flexion intact Incision: no drainage  Assessment/Plan: 2 Days Post-Op Procedure(s) (LRB): POSTERIOR LUMBAR FUSION 1 LEVEL (Left) Advance diet Up with therapy D/C IV fluids Possible discharge tomorrow but most likely on Friday as he is slow with mobilization.  Herbert Meyer M 06/27/2013, 8:46 AM

## 2013-06-27 NOTE — Progress Notes (Signed)
Physical Therapy Treatment Patient Details Name: Herbert Meyer MRN: [id number] DOB: 09-24-1970 Today's Date: 06/27/2013 Time: 4098-1191 PT Time Calculation (min): 23 min  PT Assessment / Plan / Recommendation  History of Present Illness 42yo M s/p lumbar fusion    PT Comments   Pt reluctant for mobility states back is painful, however pain meds given at 1146.  Pt moving well, only c/o pain.  Will continue to follow.    Follow Up Recommendations  No PT follow up     Does the patient have the potential to tolerate intense rehabilitation     Barriers to Discharge        Equipment Recommendations  None recommended by PT    Recommendations for Other Services    Frequency Min 5X/week   Progress towards PT Goals Progress towards PT goals: Progressing toward goals  Plan Current plan remains appropriate    Precautions / Restrictions Precautions Precautions: Back Precaution Comments: Reviewed back precautions.   Required Braces or Orthoses: Spinal Brace Spinal Brace: Lumbar corset;Applied in sitting position Restrictions Weight Bearing Restrictions: No   Pertinent Vitals/Pain 9/10 with mobility      Mobility  Bed Mobility Bed Mobility: Rolling Left;Left Sidelying to Sit;Sitting - Scoot to Edge of Bed Rolling Left: 6: Modified independent (Device/Increase time);With rail Left Sidelying to Sit: 6: Modified independent (Device/Increase time);With rails Sitting - Scoot to Edge of Bed: 5: Supervision Details for Bed Mobility Assistance: Demos good technique.   Transfers Transfers: Sit to Stand;Stand to Sit Sit to Stand: 5: Supervision;With upper extremity assist;From bed Stand to Sit: 5: Supervision;With upper extremity assist;To chair/3-in-1 Details for Transfer Assistance: demos good use of UEs.   Ambulation/Gait Ambulation/Gait Assistance: 5: Supervision Ambulation Distance (Feet): 500 Feet Assistive device: Rolling walker Ambulation/Gait Assistance Details: cues to relax  upper body.   Gait Pattern: Step-through pattern;Decreased stride length Stairs: Yes Stairs Assistance: 5: Supervision Stairs Assistance Details (indicate cue type and reason): cues to slow down.   Stair Management Technique: Two rails;Alternating pattern;Forwards Number of Stairs: 5 Wheelchair Mobility Wheelchair Mobility: No    Exercises     PT Diagnosis:    PT Problem List:   PT Treatment Interventions:     PT Goals (current goals can now be found in the care plan section) Acute Rehab PT Goals Time For Goal Achievement: 07/03/13 Potential to Achieve Goals: Good  Visit Information  Last PT Received On: 06/27/13 Assistance Needed: +1 History of Present Illness: 42yo M s/p lumbar fusion     Subjective Data  Subjective: "Well, the pain medicine doesn't work."   Copywriter, advertising Arousal/Alertness: Awake/alert Behavior During Therapy: WFL for tasks assessed/performed Overall Cognitive Status: Within Functional Limits for tasks assessed    Balance  Balance Balance Assessed: No  End of Session PT - End of Session Equipment Utilized During Treatment: Back brace Activity Tolerance: Patient tolerated treatment well Patient left: in chair;with call bell/phone within reach;with family/visitor present Nurse Communication: Mobility status   GP     RitenourAlison Murray, Tensed [phone] 06/27/2013, 2:31 PM

## 2013-06-27 NOTE — Progress Notes (Signed)
Patient ID: Herbert Meyer, male   DOB: May 21, 1971, 42 y.o.   MRN: [id number] Abdominal tenderness, no bowel movement post op, plan dulcolax supp, and if not results then fleets.  Plan Home in AM.

## 2013-06-28 LAB — GLUCOSE, CAPILLARY: Glucose-Capillary: 150 mg/dL — ABNORMAL HIGH (ref 70–99)

## 2013-06-28 NOTE — Progress Notes (Signed)
Fleets enema given after suppository ineffective and continued complaint of abdominal cramping and discomfort.  Abdomen soft, tender and hyperactive bowel sounds at shift assessment.  Patient had small BM after enema and reports relief of abdominal discomfort.

## 2013-06-28 NOTE — Progress Notes (Signed)
06/28/13 Spoke with Dianah Field, faxed orders for 3N1 and hip kit to [facility] at [phone] and received confirmation.Jacquelynn Cree RN, BSN, CCM    06/26/13 Received voicemail from Workers Comp CM Dianah Field [phone] requesting call back with d/c needs. Spoke with patient, he stated that he has a rolling walker but will need a new cane. Contacted Zella Ball, she will get patient a cane, will need order faxed to [phone]. Faxed order, received confirmation. Will continue to follow for d/c needs. Jacquelynn Cree RN, BSN, CCM

## 2013-06-28 NOTE — Progress Notes (Signed)
Physical Therapy Treatment Patient Details Name: Herbert Meyer MRN: [id number] DOB: June 09, 1971 Today's Date: 06/28/2013 Time: 5621-3086 PT Time Calculation (min): 13 min  PT Assessment / Plan / Recommendation  History of Present Illness 42yo M s/p lumbar fusion    PT Comments   Pt moving well despite pain.  Safe to d/c home from mobility standpoint.    Follow Up Recommendations  No PT follow up     Does the patient have the potential to tolerate intense rehabilitation     Barriers to Discharge        Equipment Recommendations  None recommended by PT    Recommendations for Other Services    Frequency Min 5X/week   Progress towards PT Goals Progress towards PT goals: Progressing toward goals  Plan Current plan remains appropriate    Precautions / Restrictions Precautions Precautions: Back Precaution Comments: Pt able to verbalize 3/3 back precautions Required Braces or Orthoses: Spinal Brace Spinal Brace: Lumbar corset;Applied in sitting position Restrictions Weight Bearing Restrictions: No   Pertinent Vitals/Pain 6/10 back & front of LE's. Repositioned for comfort.      Mobility  Bed Mobility Bed Mobility: Rolling Left;Left Sidelying to Sit;Sitting - Scoot to Edge of Bed Rolling Left: 6: Modified independent (Device/Increase time) Left Sidelying to Sit: 6: Modified independent (Device/Increase time);HOB flat Transfers Transfers: Sit to Stand;Stand to Sit Sit to Stand: 6: Modified independent (Device/Increase time);With upper extremity assist;From bed Stand to Sit: 6: Modified independent (Device/Increase time);With upper extremity assist;With armrests;To chair/3-in-1 Ambulation/Gait Ambulation/Gait Assistance: 5: Supervision Ambulation Distance (Feet): 250 Feet Assistive device: Rolling walker Ambulation/Gait Assistance Details: Cues to relax upper body.  Gait Pattern: Step-through pattern;Decreased stride length Gait velocity: decreased Stairs: Yes Stairs  Assistance: 6: Modified independent (Device/Increase time) Stair Management Technique: Two rails;Alternating pattern;Forwards Number of Stairs: 6 Wheelchair Mobility Wheelchair Mobility: No      PT Goals (current goals can now be found in the care plan section) Acute Rehab PT Goals PT Goal Formulation: With patient Time For Goal Achievement: 07/03/13 Potential to Achieve Goals: Good  Visit Information  Last PT Received On: 06/28/13 Assistance Needed: +1 History of Present Illness: 42yo M s/p lumbar fusion     Subjective Data      Cognition  Cognition Arousal/Alertness: Awake/alert Behavior During Therapy: WFL for tasks assessed/performed Overall Cognitive Status: Within Functional Limits for tasks assessed    Balance     End of Session PT - End of Session Equipment Utilized During Treatment: Back brace Activity Tolerance: Patient tolerated treatment well Patient left: in chair;with call bell/phone within reach;with family/visitor present Nurse Communication: Mobility status   GP     Lara Mulch 06/28/2013, 11:36 AM   Verdell Face, PTA [phone] 06/28/2013

## 2013-06-28 NOTE — Progress Notes (Signed)
Subjective: 3 Days Post-Op Procedure(s) (LRB): POSTERIOR LUMBAR FUSION 1 LEVEL (Left) Patient reports pain as moderate.    Objective: Vital signs in last 24 hours: Temp:  [98.2 F (36.8 C)-99.4 F (37.4 C)] 98.2 F (36.8 C) (10/30 0609) Pulse Rate:  [99-110] 110 (10/30 0609) Resp:  [18] 18 (10/30 0609) BP: (111-120)/(75-79) 114/79 mmHg (10/30 0609) SpO2:  [96 %-98 %] 98 % (10/30 0609)  Intake/Output from previous day: 10/29 0701 - 10/30 0700 In: 480 [P.O.:480] Out: 900 [Urine:900] Intake/Output this shift:     Recent Labs  06/26/13 0525  HGB 11.6*    Recent Labs  06/26/13 0525  WBC 9.6  RBC 3.70*  HCT 34.1*  PLT 229    Recent Labs  06/26/13 0525  NA 138  K 3.9  CL 102  CO2 26  BUN 15  CREATININE 0.81  GLUCOSE 110*  CALCIUM 8.3*   No results found for this basename: LABPT, INR,  in the last 72 hours  Neurologically intact  Assessment/Plan: 3 Days Post-Op Procedure(s) (LRB): POSTERIOR LUMBAR FUSION 1 LEVEL (Left) Discharge home with home health equipment.   Office 1 to 2 wks. He needs to ambulate daily at home.   Herbert Meyer C 06/28/2013, 7:48 AM

## 2013-06-28 NOTE — Progress Notes (Signed)
Discharge instructions explained to patient. Discussed workers comp and delivery of required bedside commode to house. Vital signs stable. No questions or concerns at this time. Pt discharged via wheelchair accompanied by wife with discharge instructions, personal belongings, and prescriptions in hand

## 2013-06-28 NOTE — Progress Notes (Signed)
Patient ambulated around nurse's station and back to room with walker and steady gait.  Donned and doffed brace by himself.  Independent in bed mobility.  Patient tolerated activity well.

## 2013-07-03 NOTE — Discharge Summary (Signed)
Physician Discharge Summary  Patient ID: Herbert Meyer MRN: [id number] DOB/AGE: 1971-06-24 42 y.o.  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 Depression   . Kidney stones     passed  . Blood in stool   . Diabetes mellitus without complication     dx 2012    Surgeries: Procedure(s): POSTERIOR LUMBAR FUSION 1 LEVEL on 06/25/2013: Microscope-assisted L4-5 microdiskectomy for recurrent  herniated nucleus pulposus. Decompression L4-5 and L3-4 with left L4-5  interbody fusion. Bilateral L4-L5 lateral gutter fusion with pedicle  instrumentation. Synthes matrix screws 45 mm rods DePuy [place] curved  11 mm height cage 5-degree lordotic. Local interbody bone and local  bone in the cage. 6 x 45 screws on the left and 6 x 40 mm screws on the  right.   Consultants (if any):  none  Discharged Condition: Improved  Hospital Course: Herbert Meyer is an 42 y.o. male who was admitted 06/25/2013 with a diagnosis of Spinal stenosis of lumbar region and went to the operating room on 06/25/2013 and underwent the above named procedures.    He was given perioperative antibiotics:  Anti-infectives   Start     Dose/Rate Route Frequency Ordered Stop   06/26/13 0000  ceFAZolin (ANCEF) IVPB 1 g/50 mL premix     1 g 100 mL/hr over 30 Minutes Intravenous Every 8 hours 06/25/13 1804 06/26/13 0741   06/25/13 0600  ceFAZolin (ANCEF) IVPB 2 g/50 mL premix     2 g 100 mL/hr over 30 Minutes Intravenous On call to O.R. 06/24/13 1622 06/25/13 1600    Diet was held to clear liquids until bowel function returned adequately.  Slow with BM and required enema.  Diabetes covered with SSI. Voiding  independently.  Slow to mobilize secondary to poor pain control. Once pain controlled he became independent with activity and ADLs including don and doffing the brace.  He was given sequential compression devices, early ambulation for DVT prophylaxis.  He benefited maximally from the hospital stay and there were no complications.    Recent vital signs:  Filed Vitals:   06/28/13 1257  BP: 120/79  Pulse: 96  Temp: 98.4 F (36.9 C)  Resp: 18    Recent laboratory studies:  Lab Results  Component Value Date   HGB 11.6* 06/26/2013   HGB 14.9 06/20/2013   HGB 14.4 01/10/2013   Lab Results  Component Value Date   WBC 9.6 06/26/2013   PLT 229 06/26/2013   Lab Results  Component Value Date   INR 0.99 06/20/2013   Lab Results  Component Value Date   NA 138 06/26/2013   K 3.9 06/26/2013   CL 102 06/26/2013   CO2 26 06/26/2013   BUN 15 06/26/2013   CREATININE 0.81 06/26/2013   GLUCOSE 110* 06/26/2013    Discharge Medications:     Medication List         amitriptyline 25 MG tablet  Commonly known as:  ELAVIL  Take 25 mg by mouth at bedtime.     carbamazepine 200 MG 12 hr tablet  Commonly known as:  TEGRETOL XR  Take 200 mg by mouth 2 (two) times daily.     DSS 100 MG Caps  Take 100 mg by  mouth 2 (two) times daily.     INVOKANA 100 MG Tabs  Generic drug:  Canagliflozin  Take 100 mg by mouth daily.     lamoTRIgine 100 MG tablet  Commonly known as:  LAMICTAL  Take 200 mg by mouth 2 (two) times daily.     meloxicam 15 MG tablet  Commonly known as:  MOBIC  Take 15 mg by mouth daily.     metFORMIN 850 MG tablet  Commonly known as:  GLUCOPHAGE  Take 850 mg by mouth 2 (two) times daily with a meal.     methocarbamol 500 MG tablet  Commonly known as:  ROBAXIN  Take 1 tablet (500 mg total) by mouth every 6 (six) hours as needed (spasm).     methocarbamol 500 MG tablet  Commonly known as:  ROBAXIN  Take 1 tablet (500 mg total) by mouth every 6 (six) hours as  needed.     oxyCODONE-acetaminophen 5-325 MG per tablet  Commonly known as:  ROXICET  Take 1-2 tablets by mouth every 4 (four) hours as needed for pain.     oxyCODONE-acetaminophen 5-325 MG per tablet  Commonly known as:  PERCOCET/ROXICET  Take 1-2 tablets by mouth every 4 (four) hours as needed.        Diagnostic Studies: Dg Lumbar Spine 2-3 Views  06/25/2013   CLINICAL DATA:  Lumbar disc protrusion.  EXAM: LUMBAR SPINE - 2-3 VIEW  COMPARISON:  MRI dated 03/15/2013  FINDINGS: AP and lateral C-arm images demonstrate the patient has undergone interbody and posterior fusion at L4-5. Alignment is anatomic.  IMPRESSION: Fusion performed at L4-5.   Electronically Signed   By: Geanie Cooley M.D.   On: 06/25/2013 17:04   Dg C-arm Gt 120 Min-no Report  06/25/2013   CLINICAL DATA: Lumbr 4-5 fusion with interbody   C-ARM GT 120 MINUTE  Fluoroscopy was utilized by the requesting physician.  No radiographic  interpretation.     Disposition: 01-Home or Self Care  DISCHARGE INSTRUCTIONS: Call if there is increasing drainage, fever greater than 101.5, severe head aches, and worsening nausea or light sensitivity. If shortness of breath, bloody cough or chest tightness or pain go to an emergency room. No lifting greater than 10 lbs. Avoid bending, stooping and twisting. Use brace when sitting and out of bed even to go to bathroom. Walk in house for first 2 weeks then may start to get out slowly increasing distances up to one mile by 4-6 weeks post op. After 5 days may shower and change dressing following bathing with shower.When bathing remove the brace shower and replace brace before getting out of the shower. If drainage, keep dry dressing and do not bathe the incision, use an moisture impervious dressing.       Follow-up Information   Follow up with Eldred Manges, MD. Schedule an appointment as soon as possible for a visit in 2 weeks.   Specialty:  Orthopedic Surgery   Contact information:    [address] [place] [address] [phone]        Signed: Wende Neighbors 07/03/2013, 10:48 AM

## 2013-12-12 DIAGNOSIS — F329 Major depressive disorder, single episode, unspecified: Secondary | ICD-10-CM | POA: Insufficient documentation

## 2018-08-14 DIAGNOSIS — H6691 Otitis media, unspecified, right ear: Secondary | ICD-10-CM | POA: Diagnosis not present

## 2018-08-14 DIAGNOSIS — H9201 Otalgia, right ear: Secondary | ICD-10-CM | POA: Diagnosis not present

## 2018-08-14 DIAGNOSIS — Z6826 Body mass index (BMI) 26.0-26.9, adult: Secondary | ICD-10-CM | POA: Diagnosis not present

## 2018-08-15 DIAGNOSIS — H9071 Mixed conductive and sensorineural hearing loss, unilateral, right ear, with unrestricted hearing on the contralateral side: Secondary | ICD-10-CM | POA: Diagnosis not present

## 2018-08-15 DIAGNOSIS — J342 Deviated nasal septum: Secondary | ICD-10-CM | POA: Diagnosis not present

## 2018-08-15 DIAGNOSIS — H9201 Otalgia, right ear: Secondary | ICD-10-CM | POA: Diagnosis not present

## 2018-08-15 DIAGNOSIS — J3489 Other specified disorders of nose and nasal sinuses: Secondary | ICD-10-CM | POA: Diagnosis not present

## 2018-08-15 DIAGNOSIS — H66011 Acute suppurative otitis media with spontaneous rupture of ear drum, right ear: Secondary | ICD-10-CM | POA: Diagnosis not present

## 2018-08-29 DIAGNOSIS — H6981 Other specified disorders of Eustachian tube, right ear: Secondary | ICD-10-CM | POA: Diagnosis not present

## 2018-08-29 DIAGNOSIS — Z8669 Personal history of other diseases of the nervous system and sense organs: Secondary | ICD-10-CM | POA: Diagnosis not present

## 2018-08-29 DIAGNOSIS — J342 Deviated nasal septum: Secondary | ICD-10-CM | POA: Diagnosis not present

## 2018-08-29 DIAGNOSIS — J3489 Other specified disorders of nose and nasal sinuses: Secondary | ICD-10-CM | POA: Diagnosis not present

## 2018-09-22 DIAGNOSIS — H60311 Diffuse otitis externa, right ear: Secondary | ICD-10-CM | POA: Diagnosis not present

## 2018-09-29 DIAGNOSIS — H60311 Diffuse otitis externa, right ear: Secondary | ICD-10-CM | POA: Diagnosis not present

## 2018-12-20 DIAGNOSIS — E785 Hyperlipidemia, unspecified: Secondary | ICD-10-CM | POA: Diagnosis not present

## 2018-12-20 DIAGNOSIS — Z125 Encounter for screening for malignant neoplasm of prostate: Secondary | ICD-10-CM | POA: Diagnosis not present

## 2018-12-20 DIAGNOSIS — F329 Major depressive disorder, single episode, unspecified: Secondary | ICD-10-CM | POA: Diagnosis not present

## 2018-12-20 DIAGNOSIS — E1165 Type 2 diabetes mellitus with hyperglycemia: Secondary | ICD-10-CM | POA: Diagnosis not present

## 2019-01-04 DIAGNOSIS — Z6824 Body mass index (BMI) 24.0-24.9, adult: Secondary | ICD-10-CM | POA: Diagnosis not present

## 2019-01-04 DIAGNOSIS — E1165 Type 2 diabetes mellitus with hyperglycemia: Secondary | ICD-10-CM | POA: Diagnosis not present

## 2019-01-04 DIAGNOSIS — Z23 Encounter for immunization: Secondary | ICD-10-CM | POA: Diagnosis not present

## 2019-01-04 DIAGNOSIS — Z0000 Encounter for general adult medical examination without abnormal findings: Secondary | ICD-10-CM | POA: Diagnosis not present

## 2019-01-04 DIAGNOSIS — F329 Major depressive disorder, single episode, unspecified: Secondary | ICD-10-CM | POA: Diagnosis not present

## 2019-01-23 DIAGNOSIS — E1165 Type 2 diabetes mellitus with hyperglycemia: Secondary | ICD-10-CM | POA: Diagnosis not present

## 2019-01-23 DIAGNOSIS — Z79899 Other long term (current) drug therapy: Secondary | ICD-10-CM | POA: Diagnosis not present

## 2019-01-23 DIAGNOSIS — Z6825 Body mass index (BMI) 25.0-25.9, adult: Secondary | ICD-10-CM | POA: Diagnosis not present

## 2019-02-06 DIAGNOSIS — Z716 Tobacco abuse counseling: Secondary | ICD-10-CM | POA: Diagnosis not present

## 2019-02-06 DIAGNOSIS — F329 Major depressive disorder, single episode, unspecified: Secondary | ICD-10-CM | POA: Diagnosis not present

## 2019-02-06 DIAGNOSIS — N529 Male erectile dysfunction, unspecified: Secondary | ICD-10-CM | POA: Diagnosis not present

## 2019-02-06 DIAGNOSIS — E1165 Type 2 diabetes mellitus with hyperglycemia: Secondary | ICD-10-CM | POA: Diagnosis not present

## 2019-05-23 DIAGNOSIS — E1165 Type 2 diabetes mellitus with hyperglycemia: Secondary | ICD-10-CM | POA: Diagnosis not present

## 2019-05-23 DIAGNOSIS — B349 Viral infection, unspecified: Secondary | ICD-10-CM | POA: Diagnosis not present

## 2019-05-23 DIAGNOSIS — F329 Major depressive disorder, single episode, unspecified: Secondary | ICD-10-CM | POA: Diagnosis not present

## 2019-05-23 DIAGNOSIS — J020 Streptococcal pharyngitis: Secondary | ICD-10-CM | POA: Diagnosis not present

## 2019-05-30 DIAGNOSIS — M79606 Pain in leg, unspecified: Secondary | ICD-10-CM | POA: Diagnosis not present

## 2019-05-30 DIAGNOSIS — E785 Hyperlipidemia, unspecified: Secondary | ICD-10-CM | POA: Diagnosis not present

## 2019-05-30 DIAGNOSIS — E1165 Type 2 diabetes mellitus with hyperglycemia: Secondary | ICD-10-CM | POA: Diagnosis not present

## 2019-05-30 DIAGNOSIS — F329 Major depressive disorder, single episode, unspecified: Secondary | ICD-10-CM | POA: Diagnosis not present

## 2019-08-29 DIAGNOSIS — E1165 Type 2 diabetes mellitus with hyperglycemia: Secondary | ICD-10-CM | POA: Diagnosis not present

## 2019-08-29 DIAGNOSIS — E785 Hyperlipidemia, unspecified: Secondary | ICD-10-CM | POA: Diagnosis not present

## 2019-08-29 DIAGNOSIS — Z6826 Body mass index (BMI) 26.0-26.9, adult: Secondary | ICD-10-CM | POA: Diagnosis not present

## 2019-08-29 DIAGNOSIS — Z1211 Encounter for screening for malignant neoplasm of colon: Secondary | ICD-10-CM | POA: Diagnosis not present

## 2019-09-26 DIAGNOSIS — R109 Unspecified abdominal pain: Secondary | ICD-10-CM | POA: Diagnosis not present

## 2019-09-27 DIAGNOSIS — R1032 Left lower quadrant pain: Secondary | ICD-10-CM | POA: Diagnosis not present

## 2019-09-27 DIAGNOSIS — R1012 Left upper quadrant pain: Secondary | ICD-10-CM | POA: Diagnosis not present

## 2019-09-27 DIAGNOSIS — R109 Unspecified abdominal pain: Secondary | ICD-10-CM | POA: Diagnosis not present

## 2019-10-01 DIAGNOSIS — Z20822 Contact with and (suspected) exposure to covid-19: Secondary | ICD-10-CM | POA: Diagnosis not present

## 2019-10-03 DIAGNOSIS — I10 Essential (primary) hypertension: Secondary | ICD-10-CM | POA: Diagnosis not present

## 2019-10-03 DIAGNOSIS — D125 Benign neoplasm of sigmoid colon: Secondary | ICD-10-CM | POA: Diagnosis not present

## 2019-10-03 DIAGNOSIS — K5730 Diverticulosis of large intestine without perforation or abscess without bleeding: Secondary | ICD-10-CM | POA: Diagnosis not present

## 2019-10-03 DIAGNOSIS — K635 Polyp of colon: Secondary | ICD-10-CM | POA: Diagnosis not present

## 2019-10-03 DIAGNOSIS — E119 Type 2 diabetes mellitus without complications: Secondary | ICD-10-CM | POA: Diagnosis not present

## 2019-10-03 DIAGNOSIS — K648 Other hemorrhoids: Secondary | ICD-10-CM | POA: Diagnosis not present

## 2019-10-03 DIAGNOSIS — Z7984 Long term (current) use of oral hypoglycemic drugs: Secondary | ICD-10-CM | POA: Diagnosis not present

## 2019-10-03 DIAGNOSIS — Z79899 Other long term (current) drug therapy: Secondary | ICD-10-CM | POA: Diagnosis not present

## 2019-10-03 DIAGNOSIS — F17200 Nicotine dependence, unspecified, uncomplicated: Secondary | ICD-10-CM | POA: Diagnosis not present

## 2019-10-03 DIAGNOSIS — Z1211 Encounter for screening for malignant neoplasm of colon: Secondary | ICD-10-CM | POA: Diagnosis not present

## 2019-10-05 DIAGNOSIS — K648 Other hemorrhoids: Secondary | ICD-10-CM | POA: Diagnosis not present

## 2019-10-08 DIAGNOSIS — R109 Unspecified abdominal pain: Secondary | ICD-10-CM | POA: Diagnosis not present

## 2019-10-08 DIAGNOSIS — R1012 Left upper quadrant pain: Secondary | ICD-10-CM | POA: Diagnosis not present

## 2019-11-01 DIAGNOSIS — K648 Other hemorrhoids: Secondary | ICD-10-CM | POA: Diagnosis not present

## 2019-12-10 DIAGNOSIS — Z20822 Contact with and (suspected) exposure to covid-19: Secondary | ICD-10-CM | POA: Diagnosis not present

## 2019-12-12 DIAGNOSIS — J069 Acute upper respiratory infection, unspecified: Secondary | ICD-10-CM | POA: Diagnosis not present

## 2019-12-12 DIAGNOSIS — Z20822 Contact with and (suspected) exposure to covid-19: Secondary | ICD-10-CM | POA: Diagnosis not present

## 2019-12-12 DIAGNOSIS — J029 Acute pharyngitis, unspecified: Secondary | ICD-10-CM | POA: Diagnosis not present

## 2019-12-12 DIAGNOSIS — B349 Viral infection, unspecified: Secondary | ICD-10-CM | POA: Diagnosis not present

## 2019-12-18 DIAGNOSIS — Z79899 Other long term (current) drug therapy: Secondary | ICD-10-CM | POA: Diagnosis not present

## 2019-12-18 DIAGNOSIS — E559 Vitamin D deficiency, unspecified: Secondary | ICD-10-CM | POA: Diagnosis not present

## 2019-12-18 DIAGNOSIS — H538 Other visual disturbances: Secondary | ICD-10-CM | POA: Diagnosis not present

## 2019-12-18 DIAGNOSIS — E785 Hyperlipidemia, unspecified: Secondary | ICD-10-CM | POA: Diagnosis not present

## 2019-12-18 DIAGNOSIS — E1165 Type 2 diabetes mellitus with hyperglycemia: Secondary | ICD-10-CM | POA: Diagnosis not present

## 2020-01-15 DIAGNOSIS — K648 Other hemorrhoids: Secondary | ICD-10-CM | POA: Diagnosis not present

## 2020-02-06 DIAGNOSIS — R079 Chest pain, unspecified: Secondary | ICD-10-CM | POA: Diagnosis not present

## 2020-02-06 DIAGNOSIS — T65891A Toxic effect of other specified substances, accidental (unintentional), initial encounter: Secondary | ICD-10-CM | POA: Diagnosis not present

## 2020-02-06 DIAGNOSIS — Z7984 Long term (current) use of oral hypoglycemic drugs: Secondary | ICD-10-CM | POA: Diagnosis not present

## 2020-02-06 DIAGNOSIS — R0602 Shortness of breath: Secondary | ICD-10-CM | POA: Diagnosis not present

## 2020-02-06 DIAGNOSIS — E1165 Type 2 diabetes mellitus with hyperglycemia: Secondary | ICD-10-CM | POA: Diagnosis not present

## 2020-02-06 DIAGNOSIS — J688 Other respiratory conditions due to chemicals, gases, fumes and vapors: Secondary | ICD-10-CM | POA: Diagnosis not present

## 2020-02-06 DIAGNOSIS — Y9289 Other specified places as the place of occurrence of the external cause: Secondary | ICD-10-CM | POA: Diagnosis not present

## 2020-02-06 DIAGNOSIS — T59811A Toxic effect of smoke, accidental (unintentional), initial encounter: Secondary | ICD-10-CM | POA: Diagnosis not present

## 2020-02-06 DIAGNOSIS — J9811 Atelectasis: Secondary | ICD-10-CM | POA: Diagnosis not present

## 2020-02-06 DIAGNOSIS — J705 Respiratory conditions due to smoke inhalation: Secondary | ICD-10-CM | POA: Diagnosis not present

## 2020-02-06 DIAGNOSIS — Z79899 Other long term (current) drug therapy: Secondary | ICD-10-CM | POA: Diagnosis not present

## 2020-02-06 DIAGNOSIS — F17210 Nicotine dependence, cigarettes, uncomplicated: Secondary | ICD-10-CM | POA: Diagnosis not present

## 2020-02-06 DIAGNOSIS — I10 Essential (primary) hypertension: Secondary | ICD-10-CM | POA: Diagnosis not present

## 2020-02-20 DIAGNOSIS — E119 Type 2 diabetes mellitus without complications: Secondary | ICD-10-CM | POA: Diagnosis not present

## 2020-05-31 DIAGNOSIS — Z20828 Contact with and (suspected) exposure to other viral communicable diseases: Secondary | ICD-10-CM | POA: Diagnosis not present

## 2020-05-31 DIAGNOSIS — Z20822 Contact with and (suspected) exposure to covid-19: Secondary | ICD-10-CM | POA: Diagnosis not present

## 2021-08-17 ENCOUNTER — Encounter (HOSPITAL_COMMUNITY): Payer: Self-pay | Admitting: Emergency Medicine

## 2021-08-17 ENCOUNTER — Inpatient Hospital Stay (HOSPITAL_COMMUNITY)
Admission: EM | Admit: 2021-08-17 | Discharge: 2021-08-20 | DRG: 637 | Disposition: A | Discharge status: Home / Self Care | Payer: BC Managed Care – PPO | Attending: Family Medicine | Admitting: Family Medicine

## 2021-08-17 ENCOUNTER — Other Ambulatory Visit: Payer: Self-pay

## 2021-08-17 ENCOUNTER — Emergency Department (HOSPITAL_COMMUNITY): Payer: BC Managed Care – PPO

## 2021-08-17 DIAGNOSIS — E7800 Pure hypercholesterolemia, unspecified: Secondary | ICD-10-CM | POA: Diagnosis present

## 2021-08-17 DIAGNOSIS — Z7984 Long term (current) use of oral hypoglycemic drugs: Secondary | ICD-10-CM

## 2021-08-17 DIAGNOSIS — E785 Hyperlipidemia, unspecified: Secondary | ICD-10-CM

## 2021-08-17 DIAGNOSIS — E119 Type 2 diabetes mellitus without complications: Secondary | ICD-10-CM

## 2021-08-17 DIAGNOSIS — B3781 Candidal esophagitis: Secondary | ICD-10-CM | POA: Diagnosis present

## 2021-08-17 DIAGNOSIS — E1165 Type 2 diabetes mellitus with hyperglycemia: Secondary | ICD-10-CM | POA: Diagnosis present

## 2021-08-17 DIAGNOSIS — Z888 Allergy status to other drugs, medicaments and biological substances status: Secondary | ICD-10-CM | POA: Diagnosis not present

## 2021-08-17 DIAGNOSIS — E781 Pure hyperglyceridemia: Secondary | ICD-10-CM | POA: Diagnosis present

## 2021-08-17 DIAGNOSIS — E440 Moderate protein-calorie malnutrition: Secondary | ICD-10-CM | POA: Diagnosis present

## 2021-08-17 DIAGNOSIS — F17210 Nicotine dependence, cigarettes, uncomplicated: Secondary | ICD-10-CM | POA: Diagnosis present

## 2021-08-17 DIAGNOSIS — U071 COVID-19: Secondary | ICD-10-CM | POA: Diagnosis present

## 2021-08-17 DIAGNOSIS — F1910 Other psychoactive substance abuse, uncomplicated: Secondary | ICD-10-CM | POA: Diagnosis present

## 2021-08-17 DIAGNOSIS — Z9489 Other transplanted organ and tissue status: Secondary | ICD-10-CM | POA: Diagnosis not present

## 2021-08-17 DIAGNOSIS — B370 Candidal stomatitis: Secondary | ICD-10-CM | POA: Diagnosis present

## 2021-08-17 DIAGNOSIS — Z9114 Patient's other noncompliance with medication regimen: Secondary | ICD-10-CM

## 2021-08-17 DIAGNOSIS — R451 Restlessness and agitation: Secondary | ICD-10-CM | POA: Diagnosis not present

## 2021-08-17 DIAGNOSIS — Z6829 Body mass index (BMI) 29.0-29.9, adult: Secondary | ICD-10-CM

## 2021-08-17 DIAGNOSIS — E1100 Type 2 diabetes mellitus with hyperosmolarity without nonketotic hyperglycemic-hyperosmolar coma (NKHHC): Principal | ICD-10-CM | POA: Diagnosis present

## 2021-08-17 DIAGNOSIS — I4519 Other right bundle-branch block: Secondary | ICD-10-CM | POA: Diagnosis present

## 2021-08-17 DIAGNOSIS — F1990 Other psychoactive substance use, unspecified, uncomplicated: Secondary | ICD-10-CM | POA: Diagnosis present

## 2021-08-17 DIAGNOSIS — Z79899 Other long term (current) drug therapy: Secondary | ICD-10-CM

## 2021-08-17 DIAGNOSIS — E669 Obesity, unspecified: Secondary | ICD-10-CM | POA: Diagnosis present

## 2021-08-17 DIAGNOSIS — E1110 Type 2 diabetes mellitus with ketoacidosis without coma: Secondary | ICD-10-CM | POA: Diagnosis not present

## 2021-08-17 DIAGNOSIS — R825 Elevated urine levels of drugs, medicaments and biological substances: Secondary | ICD-10-CM | POA: Diagnosis present

## 2021-08-17 LAB — BASIC METABOLIC PANEL
Anion gap: 15 (ref 5–15)
Anion gap: 10 (ref 5–15)
BUN: 29 mg/dL — ABNORMAL HIGH (ref 6–20)
BUN: 25 mg/dL — ABNORMAL HIGH (ref 6–20)
CO2: 18 mmol/L — ABNORMAL LOW (ref 22–32)
CO2: 22 mmol/L (ref 22–32)
Calcium: 9.2 mg/dL (ref 8.9–10.3)
Calcium: 8.5 mg/dL — ABNORMAL LOW (ref 8.9–10.3)
Chloride: 89 mmol/L — ABNORMAL LOW (ref 98–111)
Chloride: 99 mmol/L (ref 98–111)
Creatinine, Ser: 0.31 mg/dL — ABNORMAL LOW (ref 0.61–1.24)
Creatinine, Ser: 0.57 mg/dL — ABNORMAL LOW (ref 0.61–1.24)
GFR, Estimated: >60 mL/min (ref >60)
GFR, Estimated: >60 mL/min (ref >60)
Glucose, Bld: 558 mg/dL (ref 70–99)
Glucose, Bld: 184 mg/dL — ABNORMAL HIGH (ref 70–99)
Potassium: 5.4 mmol/L — ABNORMAL HIGH (ref 3.5–5.1)
Potassium: 4.5 mmol/L (ref 3.5–5.1)
Sodium: 122 mmol/L — ABNORMAL LOW (ref 135–145)
Sodium: 131 mmol/L — ABNORMAL LOW (ref 135–145)

## 2021-08-17 LAB — URINALYSIS, ROUTINE W REFLEX MICROSCOPIC
Bilirubin Urine: NEGATIVE
Glucose, UA: >=500 mg/dL — AB
Hgb urine dipstick: NEGATIVE
Ketones, ur: >80 mg/dL — AB
Leukocytes,Ua: NEGATIVE
Nitrite: NEGATIVE
Protein, ur: NEGATIVE mg/dL
Specific Gravity, Urine: 1.02 (ref 1.005–1.030)
pH: 5 (ref 5.0–8.0)

## 2021-08-17 LAB — LIPID PANEL
Cholesterol: 348 mg/dL — ABNORMAL HIGH (ref 0–200)
HDL: 27 mg/dL — ABNORMAL LOW (ref >40)
LDL Cholesterol: UNDETERMINED mg/dL (ref 0–99)
Triglycerides: 4330 mg/dL — ABNORMAL HIGH (ref <150)
VLDL: UNDETERMINED mg/dL (ref 0–40)

## 2021-08-17 LAB — I-STAT VENOUS BLOOD GAS, ED
Acid-Base Excess: 1 mmol/L (ref 0.0–2.0)
Bicarbonate: 27.9 mmol/L (ref 20.0–28.0)
Calcium, Ion: 1.17 mmol/L (ref 1.15–1.40)
HCT: 50 % (ref 39.0–52.0)
Hemoglobin: 17 g/dL (ref 13.0–17.0)
O2 Saturation: 75 %
Potassium: 6.2 mmol/L — ABNORMAL HIGH (ref 3.5–5.1)
Sodium: 125 mmol/L — ABNORMAL LOW (ref 135–145)
TCO2: 29 mmol/L (ref 22–32)
pCO2, Ven: 49.1 mmHg (ref 44.0–60.0)
pH, Ven: 7.362 (ref 7.250–7.430)
pO2, Ven: 42 mmHg (ref 32.0–45.0)

## 2021-08-17 LAB — CBC
HCT: 43.7 % (ref 39.0–52.0)
Hemoglobin: 17.3 g/dL — ABNORMAL HIGH (ref 13.0–17.0)
Hemoglobin: 16.3 g/dL (ref 13.0–17.0)
MCH: 32.5 pg (ref 26.0–34.0)
MCHC: 37.3 g/dL — ABNORMAL HIGH (ref 30.0–36.0)
MCV: 87.1 fL (ref 80.0–100.0)
Platelets: 374 10*3/uL (ref 150–400)
Platelets: 405 10*3/uL — ABNORMAL HIGH (ref 150–400)
RBC: 5.02 MIL/uL (ref 4.22–5.81)
RDW: 12.5 % (ref 11.5–15.5)
WBC: 14 10*3/uL — ABNORMAL HIGH (ref 4.0–10.5)
WBC: 17.8 10*3/uL — ABNORMAL HIGH (ref 4.0–10.5)
nRBC: 0 % (ref 0.0–0.2)
nRBC: 0 % (ref 0.0–0.2)

## 2021-08-17 LAB — CBG MONITORING, ED
Glucose-Capillary: 199 mg/dL — ABNORMAL HIGH (ref 70–99)
Glucose-Capillary: 240 mg/dL — ABNORMAL HIGH (ref 70–99)
Glucose-Capillary: 240 mg/dL — ABNORMAL HIGH (ref 70–99)
Glucose-Capillary: 249 mg/dL — ABNORMAL HIGH (ref 70–99)
Glucose-Capillary: 306 mg/dL — ABNORMAL HIGH (ref 70–99)
Glucose-Capillary: 371 mg/dL — ABNORMAL HIGH (ref 70–99)
Glucose-Capillary: 434 mg/dL — ABNORMAL HIGH (ref 70–99)
Glucose-Capillary: 578 mg/dL (ref 70–99)

## 2021-08-17 LAB — HEMOGLOBIN A1C
Hgb A1c MFr Bld: 12.9 % — ABNORMAL HIGH (ref 4.8–5.6)
Mean Plasma Glucose: 323.53 mg/dL

## 2021-08-17 LAB — RAPID URINE DRUG SCREEN, HOSP PERFORMED
Amphetamines: POSITIVE — AB
Barbiturates: NOT DETECTED
Benzodiazepines: NOT DETECTED
Cocaine: NOT DETECTED
Opiates: NOT DETECTED
Tetrahydrocannabinol: NOT DETECTED

## 2021-08-17 LAB — TROPONIN I (HIGH SENSITIVITY)
Troponin I (High Sensitivity): 6 ng/L (ref <18)
Troponin I (High Sensitivity): 5 ng/L (ref <18)

## 2021-08-17 LAB — HIV ANTIBODY (ROUTINE TESTING W REFLEX): HIV Screen 4th Generation wRfx: NONREACTIVE

## 2021-08-17 LAB — MAGNESIUM: Magnesium: 2.2 mg/dL (ref 1.7–2.4)

## 2021-08-17 LAB — BETA-HYDROXYBUTYRIC ACID
Beta-Hydroxybutyric Acid: 3.17 mmol/L — ABNORMAL HIGH (ref 0.05–0.27)
Beta-Hydroxybutyric Acid: 0.62 mmol/L — ABNORMAL HIGH (ref 0.05–0.27)

## 2021-08-17 LAB — LDL CHOLESTEROL, DIRECT

## 2021-08-17 LAB — URINALYSIS, MICROSCOPIC (REFLEX): Bacteria, UA: NONE SEEN

## 2021-08-17 LAB — GROUP A STREP BY PCR: Group A Strep by PCR: NOT DETECTED

## 2021-08-17 LAB — LIPASE, BLOOD: Lipase: 46 U/L (ref 11–51)

## 2021-08-17 LAB — LACTIC ACID, PLASMA: Lactic Acid, Venous: 1.8 mmol/L (ref 0.5–1.9)

## 2021-08-17 LAB — AMYLASE: Amylase: 61 U/L (ref 28–100)

## 2021-08-17 MED ORDER — INSULIN REGULAR(HUMAN) IN NACL 100-0.9 UT/100ML-% IV SOLN
INTRAVENOUS | Status: DC
  Administered 2021-08-17: 18:00:00 16 [IU]/h via INTRAVENOUS
  Administered 2021-08-18: 03:00:00 21 [IU]/h via INTRAVENOUS
  Filled 2021-08-17 (×2): qty 100

## 2021-08-17 MED ORDER — INSULIN GLARGINE-YFGN 100 UNIT/ML ~~LOC~~ SOLN
8.0000 [IU] | Freq: Once | SUBCUTANEOUS | Status: AC
  Administered 2021-08-17: 17:00:00 8 [IU] via SUBCUTANEOUS
  Filled 2021-08-17: qty 0.08

## 2021-08-17 MED ORDER — LACTATED RINGERS IV BOLUS: 20.0000 mL/kg | Freq: Once | INTRAVENOUS | Status: DC

## 2021-08-17 MED ORDER — NYSTATIN 100000 UNIT/ML MT SUSP
5.0000 mL | Freq: Once | OROMUCOSAL | Status: AC
  Administered 2021-08-17: 17:00:00 500000 [IU] via OROMUCOSAL
  Filled 2021-08-17: qty 5

## 2021-08-17 MED ORDER — DEXTROSE IN LACTATED RINGERS 5 % IV SOLN: INTRAVENOUS | Status: DC

## 2021-08-17 MED ORDER — FLUCONAZOLE 100 MG PO TABS
100.0000 mg | ORAL_TABLET | Freq: Once | ORAL | Status: AC
  Administered 2021-08-17: 19:00:00 100 mg via ORAL
  Filled 2021-08-17: qty 1

## 2021-08-17 MED ORDER — LACTATED RINGERS IV SOLN: INTRAVENOUS | Status: DC

## 2021-08-17 MED ORDER — NICOTINE 14 MG/24HR TD PT24
14.0000 mg | MEDICATED_PATCH | Freq: Every day | TRANSDERMAL | Status: DC
  Administered 2021-08-18 – 2021-08-20 (×3): 14 mg via TRANSDERMAL
  Filled 2021-08-17 (×3): qty 1

## 2021-08-17 MED ORDER — LIDOCAINE VISCOUS HCL 2 % MT SOLN
15.0000 mL | Freq: Once | OROMUCOSAL | Status: AC
  Administered 2021-08-17: 17:00:00 15 mL via OROMUCOSAL
  Filled 2021-08-17: qty 15

## 2021-08-17 MED ORDER — DEXTROSE 50 % IV SOLN: 0.0000 mL | INTRAVENOUS | Status: DC | PRN

## 2021-08-17 MED ORDER — MORPHINE SULFATE (PF) 2 MG/ML IV SOLN
2.0000 mg | Freq: Once | INTRAVENOUS | Status: AC
  Administered 2021-08-17: 19:00:00 2 mg via INTRAVENOUS
  Filled 2021-08-17: qty 1

## 2021-08-17 MED ORDER — INSULIN GLARGINE-YFGN 100 UNIT/ML ~~LOC~~ SOLN
18.0000 [IU] | Freq: Every day | SUBCUTANEOUS | Status: DC
  Administered 2021-08-18 (×2): 18 [IU] via SUBCUTANEOUS
  Filled 2021-08-17 (×4): qty 0.18

## 2021-08-17 MED ORDER — PANTOPRAZOLE SODIUM 40 MG IV SOLR
40.0000 mg | INTRAVENOUS | Status: DC
Start: 2021-08-17 — End: 2021-08-19
  Administered 2021-08-17 – 2021-08-18 (×2): 40 mg via INTRAVENOUS
  Filled 2021-08-17 (×2): qty 40

## 2021-08-17 MED ORDER — ENOXAPARIN SODIUM 40 MG/0.4ML IJ SOSY
40.0000 mg | PREFILLED_SYRINGE | INTRAMUSCULAR | Status: DC
  Administered 2021-08-17 – 2021-08-19 (×3): 40 mg via SUBCUTANEOUS
  Filled 2021-08-17 (×3): qty 0.4

## 2021-08-17 MED ORDER — NYSTATIN 100000 UNIT/ML MT SUSP
5.0000 mL | Freq: Four times a day (QID) | OROMUCOSAL | Status: DC
  Administered 2021-08-17 – 2021-08-20 (×11): 500000 [IU] via OROMUCOSAL
  Filled 2021-08-17 (×14): qty 5

## 2021-08-17 MED ORDER — MORPHINE SULFATE (PF) 2 MG/ML IV SOLN
2.0000 mg | Freq: Once | INTRAVENOUS | Status: AC
  Administered 2021-08-17: 17:00:00 2 mg via INTRAVENOUS
  Filled 2021-08-17: qty 1

## 2021-08-17 MED ORDER — LIDOCAINE 5 % EX PTCH
1.0000 | MEDICATED_PATCH | CUTANEOUS | Status: DC
  Administered 2021-08-17 – 2021-08-19 (×3): 1 via TRANSDERMAL
  Filled 2021-08-17 (×3): qty 1

## 2021-08-17 MED ORDER — LACTATED RINGERS IV BOLUS
1000.0000 mL | INTRAVENOUS | Status: AC
  Administered 2021-08-17: 17:00:00 1000 mL via INTRAVENOUS

## 2021-08-17 NOTE — Progress Notes (Signed)
Herbert Meyer is a 50 y.o. male presenting with shortness of breath and painful throat in the setting of COVID. PMH is significant for diabetes. Since last week, he has been having shortness of breath, intractable hiccups, throat pain. He went to the doctor last week and got diagnosed with COVID and was given steroids (2 shots is what he reports). He has not been on any medications or seen any physicians since October 2021. In the ER, patient was found to have significantly elevated blood sugars and was found to be in DKA with an anion gap of 15. Patient is currently on EndoTool for sugar management. Patient was also found to have oropharyngeal thrush and was given viscous lidocaine and nystatin suspension as well as morphine for the pain.    Blood pressure (!) 158/103, pulse 100, temperature 98.3 F (36.8 C), temperature source Oral, resp. rate (!) 9, height 5\' 3"  (1.6 m), weight 75.8 kg, SpO2 98 %. General: appears mildly ill but not in acute distress CV: cardiac monitoring in place, patient intermittently tachycardic  Respiratory: breathing comfortably on room air, no increased WOB, speaking in full sentences HEENT: white lesions in oropharynx consistent with thrush   A/P Patient is currently stable and the night team will proceed with admission.   , DO  PGY-2 [facility] Team Pager: [phone]

## 2021-08-17 NOTE — ED Notes (Addendum)
Glucose   558 per lab 

## 2021-08-17 NOTE — H&P (Addendum)
[facility] Admission History and Physical Service Pager: [phone]  Patient name: Herbert Meyer Medical record number: [id number] Date of birth: 06/08/1971 Age: 50 y.o. Gender: male  Primary Care Provider: Helen Hashimoto., MD Consultants: None Code Status: FULL Preferred Emergency Contact:  Primary Emergency Contact: Herbert Meyer, Home Phone: [phone]  Chief Complaint: Shortness of Breath   Assessment and Plan: Herbert Meyer is a 50 y.o. male with PMHx of T2DM, recent COVID infection, hematochezia, and MDD, who presented with SOB, intractable hiccups, abdominal pain, sore throat x5 days, and was found to be in HHS likely 2/2 COVID infection and thrush.    HHS   T2DM Patient presented SOB, polyphagia, polyuria, polydipsia, abdominal pain, intractable hiccups x5 days. Inciting event likely recent COVID infection. No HbA1c in months, and he stated that he has not been on any antidiabetics in over a year now. BHB 3.17. UA > 500 glc and >80 ketones. BMP K+ 6.2, HCO2 89, AG 15. VBG pH 7.362, pCO2 WNL. Lactic acid pending.  IV LR 125cc/h (CBG <250 switch to LR + D5%) and IV insulin until AG closes x2, HCO2 >18 mEq/L, and PO food tolerance. Then will transition to SubQ insulin with overlapping IV insulin over 1hr. D50 if patient is hypoglycemic.  Patient admitted to progressive with attending McDiarmid, Blane Ohara, MD  Diet: NPO BMP q4h, K+>4, Mg2+ >2, HOLD insulin if K+ 3.3, hold K+ if 5.3 CGBs hourly  IV LR 125cc/h, once CBG < 250 start LR + D5% D50 if hypoglycemic <80-100 IV insulin (0.1 u/kg/hr), transition to SubQ insulin per above  Thrush Patient presented with sore throat and noted to have thrush. Patient denied use of oral steroids and IVDU. HIV panel to rule out. Nystatin suspension Follow-up HIV panel  COVID positive, day 8 VSS. Satting well on RA. Per patient, his initial symptoms started on 08/09/2021. Afebrile and up-trending WBC (!) 17.8. <  14.   CBC Contacting droplet precautions for 2 more days  ?Acute pancreatitis Patient presented with abdominal pain and intractable hiccups x4 days. He denied diarrhea, nausea or vomiting. Afebrile and WBC (!) 17.8. Cholesterol 348, triglycerides 4330, HLD 27, LDL unable to calculate. Concerning for acute pancreatitis. VSS. Fluid bolus per endotool protocol Follow-up serum amylase Follow-up serum lipase We will consider RUQ ultrasound for cholelithiasis We will consider abdominal CT We will consider repeat lipid panel once patient is more stable We will consider gabapentin for intractable  PUD (tobacco, EtOH, remote history of cocaine) Patient drinks about 1-3 packs of beer weekly on the weekend, last drink being Sunday (12 pack).  He smokes half a pack a cigarette daily.  He reported that he stopped all his medicines and started drinking more after the death of his mother last year. CIWA without Ativan q4hr Follow-up UDS Follow-up LFTs Consider SSRI and MDD/GAD screening  Goals of care Patient is full code.  He requests no blood products out of respect for his family being Jehovah witnesses.   FEN/GI: NPO Prophylaxis: Enoxaparin (lovenox) injection 40 mg start: 08/17/21 2100   Disposition:  Progressive   History of Present Illness:   Herbert Meyer is a 50 y.o. male presenting with SOB, intractable hiccups, abdominal pain, sore throat x5 days.  Stated that he went to the clinic and was found to be COVID-positive.  Symptom onset was 08/09/2021.  He denied fevers, cough, nausea or vomiting.  He is very distressed with his intractable hiccups x5 days, with associating SOB and abdominal pain. Stated  that his eyes "feel like they are going to explode".  Reported sore throat that feels like "a bunch of shaving knives down his throat".  Patient stated that he has a history of T2DM, but has not taken any medicines or taking care of himself since his mother passed away in 2023-05-18 of  last year.  Also stated that he has drank and smoked or since then.  He denied any other past medical history.  Surgery- Low back surgery, testicular transplant Etoh- Every weekend since passing of mom in 2023/05/18 of last year. Last drink was yesterday 12pk of beer. Can drink 1-3 cases of beer. Denies hx of seizures or withdrawals.  Smoking- cigarettes 1/2 pk/day. Denies vaping and marijuana use Drugs- Endorses cocaine years ago, denies IVDU, denies meth, unprescribed meds, and any other illicit substances.  Review Of Systems:  Per HPI with the following additions: Review of Systems  Constitutional:  Negative for chills, diaphoresis and fever.  HENT:  Positive for sore throat.   Eyes:  Positive for pain.  Respiratory:  Positive for shortness of breath. Negative for cough and wheezing.   Cardiovascular:  Negative for chest pain.  Gastrointestinal:  Positive for abdominal pain. Negative for blood in stool, constipation, diarrhea, nausea and vomiting.  Endocrine: Positive for polydipsia, polyphagia and polyuria.  Genitourinary:  Negative for difficulty urinating, dysuria and hematuria.  Musculoskeletal:  Positive for back pain. Negative for gait problem.  Neurological:  Positive for dizziness and headaches. Negative for tremors, seizures, syncope, speech difficulty and weakness.    Patient Active Problem List   Diagnosis Date Noted   DKA (diabetic ketoacidosis) (HCC) 08/17/2021   Major depression 12/12/2013   Spinal stenosis of lumbar region 06/25/2013    Class: Diagnosis of   HNP (herniated nucleus pulposus), lumbar 01/10/2013    Class: Diagnosis of    Past Medical History: Past Medical History:  Diagnosis Date   Blood in stool    Depression    Diabetes mellitus without complication (HCC)    dx 2012   Kidney stones    passed    Past Surgical History: Past Surgical History:  Procedure Laterality Date   BACK SURGERY     boil Right    exicision   FINGER NAIL SURGERY   1990's   implant on left little finger   LUMBAR LAMINECTOMY N/A 01/10/2013   Procedure: MICRODISCECTOMY LUMBAR LAMINECTOMY;  Surgeon: Eldred Manges, MD;  Location: [facility] OR;  Service: Orthopedics;  Laterality: N/A;  Bilateral Lumbar 4-5 Microdiscectomy   UMBILICAL HERNIA REPAIR      Social History: Social History   Tobacco Use   Smoking status: Every Day    Packs/day: 0.50    Years: 27.00    Pack years: 13.50    Types: Cigarettes  Substance Use Topics   Alcohol use: Yes    Comment: socially   Drug use: No   Please also refer to relevant sections of EMR.  Family History: No family history on file. Interview was limited, as patient was agitated.  Allergies and Medications: Allergies  Allergen Reactions   Prednisone     Pt. Reports that gets easily upset," temper flares with steroids"     No current facility-administered medications on file prior to encounter.   Current Outpatient Medications on File Prior to Encounter  Medication Sig Dispense Refill   acetaminophen (TYLENOL) 325 MG tablet Take 650 mg by mouth every 6 (six) hours as needed.     canagliflozin (INVOKANA) 100 MG TABS  tablet Take 100 mg by mouth daily. (Patient not taking: Reported on 08/17/2021)     docusate sodium 100 MG CAPS Take 100 mg by mouth 2 (two) times daily. (Patient not taking: Reported on 08/17/2021) 10 capsule 0   methocarbamol (ROBAXIN) 500 MG tablet Take 1 tablet (500 mg total) by mouth every 6 (six) hours as needed (spasm). (Patient not taking: Reported on 08/17/2021) 30 tablet 0   methocarbamol (ROBAXIN) 500 MG tablet Take 1 tablet (500 mg total) by mouth every 6 (six) hours as needed. (Patient not taking: Reported on 08/17/2021) 40 tablet 0   oxyCODONE-acetaminophen (PERCOCET/ROXICET) 5-325 MG per tablet Take 1-2 tablets by mouth every 4 (four) hours as needed. (Patient not taking: Reported on 08/17/2021) 90 tablet 0   oxyCODONE-acetaminophen (ROXICET) 5-325 MG per tablet Take 1-2 tablets by mouth  every 4 (four) hours as needed for pain. (Patient not taking: Reported on 08/17/2021) 60 tablet 0    Objective: BP (!) 148/93    Pulse 99    Temp 98.3 F (36.8 C) (Oral)    Resp 17    Ht 5\' 3"  (1.6 m)    Wt 75.8 kg    SpO2 95%    BMI 29.58 kg/m  Exam: Physical Exam Vitals and nursing note reviewed.  Constitutional:      General: He is awake. He is in acute distress.     Appearance: He is well-developed and overweight. He is ill-appearing. He is not toxic-appearing or diaphoretic.  HENT:     Head: Normocephalic and atraumatic.     Mouth/Throat:     Comments: Thrush Eyes:     Conjunctiva/sclera: Conjunctivae normal.  Cardiovascular:     Rate and Rhythm: Normal rate and regular rhythm.     Pulses:          Dorsalis pedis pulses are 1+ on the right side and 1+ on the left side.     Heart sounds: Normal heart sounds. No murmur heard.   No friction rub. No gallop.  Pulmonary:     Effort: Tachypnea present. No respiratory distress.     Breath sounds: Wheezing present. No decreased breath sounds, rhonchi or rales.  Abdominal:     General: Abdomen is flat. There is no distension.     Palpations: Abdomen is soft. There is no mass.     Tenderness: There is no abdominal tenderness. There is no guarding or rebound.  Musculoskeletal:     Right lower leg: No tenderness. No edema.     Left lower leg: No tenderness. No edema.  Skin:    General: Skin is warm and dry.     Nails: There is clubbing.  Neurological:     General: No focal deficit present.     Mental Status: He is alert and oriented to person, place, and time.     Motor: No weakness.  Psychiatric:        Mood and Affect: Mood is anxious.        Behavior: Behavior is agitated. Behavior is cooperative.    Labs and Imaging: CBC BMET  Recent Labs  Lab 08/17/21 1632  WBC 17.8*  HGB 16.3  HCT 43.7  PLT 405*   Recent Labs  Lab 08/17/21 1042 08/17/21 1252  NA 122* 125*  K 5.4* 6.2*  CL 89*  --   CO2 18*  --   BUN 29*   --   CREATININE 0.31*  --   GLUCOSE 558*  --   CALCIUM 9.2  --  EKG: QTc 470, Sinus tachycardia, right atrial enlargement, pulmonary disease pattern, incomplete RBBB, right ventricular hypertrophy   DG Chest 2 View  Result Date: 08/17/2021 CLINICAL DATA:  Chest pain. EXAM: CHEST - 2 VIEW COMPARISON:  November 10, 2012. FINDINGS: The heart size and mediastinal contours are within normal limits. Both lungs are clear. The visualized skeletal structures are unremarkable. IMPRESSION: No active cardiopulmonary disease. Electronically Signed   By: Marijo Conception M.D.   On: 08/17/2021 10:53     Merrily Brittle, DO 08/17/2021, 9:40 PM PGY-1, [facility] pager: [phone], text pages welcome   FPTS Upper-Level Resident Addendum   I have independently interviewed and examined the patient. I have discussed the above with the original author and agree with their documentation. My edits for correction/addition/clarification are in within the documentation. Please see also any attending notes.   Gerlene Fee, DO PGY-3, [facility] 08/18/2021 12:47 AM  FPTS Service pager: [phone] (text pages welcome through [facility])

## 2021-08-17 NOTE — ED Triage Notes (Addendum)
Patient who tested positive for COVID on Wednesday(symptoms started 08/09/2021) last week here for evaluation of shortness of breath and hiccups x3 days. Room air SpO2 97%. Patient's sentences are frequently interrupted by multiple hiccups. Patient alert, oriented, and ill-appearing.

## 2021-08-17 NOTE — ED Provider Notes (Signed)
[facility] EMERGENCY DEPARTMENT Provider Note   CSN: [id number] Arrival date & time: 08/17/21  1022     History Chief Complaint  Patient presents with   Shortness of Breath    Herbert Meyer is a 50 y.o. male with past medical history significant for diabetes, recent COVID infection who presents with shortness of breath, intractable hiccups, throat pain, difficulty breathing.  Patient reports that he has not taken anything for his diabetes in over a year, because his primary care provider died.  Patient reports that he was never on insulin.  Patient reports difficulty sleeping, worsening hiccups for the last 3 to 4 days.  Patient has tried decongestants, and some other medication for COVID with minimal improvement.  Patient concerned he may have pneumonia.   Shortness of Breath Associated symptoms: sore throat       Past Medical History:  Diagnosis Date   Blood in stool    Depression    Diabetes mellitus without complication (HCC)    dx 2012   Kidney stones    passed    Patient Active Problem List   Diagnosis Date Noted   Spinal stenosis of lumbar region 06/25/2013    Class: Diagnosis of   HNP (herniated nucleus pulposus), lumbar 01/10/2013    Class: Diagnosis of    Past Surgical History:  Procedure Laterality Date   BACK SURGERY     boil Right    exicision   FINGER NAIL SURGERY  1990's   implant on left little finger   LUMBAR LAMINECTOMY N/A 01/10/2013   Procedure: MICRODISCECTOMY LUMBAR LAMINECTOMY;  Surgeon: Eldred Manges, MD;  Location: [facility] OR;  Service: Orthopedics;  Laterality: N/A;  Bilateral Lumbar 4-5 Microdiscectomy   UMBILICAL HERNIA REPAIR         No family history on file.  Social History   Tobacco Use   Smoking status: Every Day    Packs/day: 0.50    Years: 27.00    Pack years: 13.50    Types: Cigarettes  Substance Use Topics   Alcohol use: Yes    Comment: socially   Drug use: No    Home Medications Prior to  Admission medications   Medication Sig Start Date End Date Taking? Authorizing Provider  acetaminophen (TYLENOL) 325 MG tablet Take 650 mg by mouth every 6 (six) hours as needed.   Yes [provider]  canagliflozin (INVOKANA) 100 MG TABS tablet Take 100 mg by mouth daily. Patient not taking: Reported on 08/17/2021    [provider]  docusate sodium 100 MG CAPS Take 100 mg by mouth 2 (two) times daily. Patient not taking: Reported on 08/17/2021 06/27/13   Maud Deed, PA-C  methocarbamol (ROBAXIN) 500 MG tablet Take 1 tablet (500 mg total) by mouth every 6 (six) hours as needed (spasm). Patient not taking: Reported on 08/17/2021 01/10/13   Maud Deed, PA-C  methocarbamol (ROBAXIN) 500 MG tablet Take 1 tablet (500 mg total) by mouth every 6 (six) hours as needed. Patient not taking: Reported on 08/17/2021 06/27/13   Maud Deed, PA-C  oxyCODONE-acetaminophen (PERCOCET/ROXICET) 5-325 MG per tablet Take 1-2 tablets by mouth every 4 (four) hours as needed. Patient not taking: Reported on 08/17/2021 06/27/13   Maud Deed, PA-C  oxyCODONE-acetaminophen (ROXICET) 5-325 MG per tablet Take 1-2 tablets by mouth every 4 (four) hours as needed for pain. Patient not taking: Reported on 08/17/2021 01/10/13   Maud Deed, PA-C    Allergies    Prednisone  Review of  Systems   Review of Systems  HENT:  Positive for sore throat.   Respiratory:  Positive for shortness of breath.        Hiccups  All other systems reviewed and are negative.  Physical Exam Updated Vital Signs BP (!) 157/98    Pulse 95    Temp 98.3 F (36.8 C) (Oral)    Resp 15    Ht 5\' 3"  (1.6 m)    Wt 75.8 kg    SpO2 97%    BMI 29.58 kg/m   Physical Exam Vitals and nursing note reviewed.  Constitutional:      General: He is in acute distress.     Appearance: Normal appearance.     Comments: Somewhat distressed patient with frequent hiccups, and somewhat ill-appearing laying on bed  HENT:     Head:  Normocephalic and atraumatic.     Comments: Posterior pharynx with white exudate consistent with oropharyngeal thrush Eyes:     General:        Right eye: No discharge.        Left eye: No discharge.  Cardiovascular:     Rate and Rhythm: Normal rate and regular rhythm.     Heart sounds: No murmur heard.   No friction rub. No gallop.     Comments: Intermittent tachycardia Pulmonary:     Effort: Pulmonary effort is normal. Tachypnea present.     Breath sounds: Normal breath sounds.  Abdominal:     General: Bowel sounds are normal.     Palpations: Abdomen is soft.  Skin:    General: Skin is warm and dry.     Capillary Refill: Capillary refill takes less than 2 seconds.  Neurological:     Mental Status: He is alert and oriented to person, place, and time.  Psychiatric:        Mood and Affect: Mood normal.        Behavior: Behavior normal.    ED Results / Procedures / Treatments   Labs (all labs ordered are listed, but only abnormal results are displayed) Labs Reviewed  BASIC METABOLIC PANEL - Abnormal; Notable for the following components:      Result Value   Sodium 122 (*)    Potassium 5.4 (*)    Chloride 89 (*)    CO2 18 (*)    Glucose, Bld 558 (*)    BUN 29 (*)    Creatinine, Ser 0.31 (*)    All other components within normal limits  CBC - Abnormal; Notable for the following components:   WBC 14.0 (*)    Hemoglobin 17.3 (*)    All other components within normal limits  BETA-HYDROXYBUTYRIC ACID - Abnormal; Notable for the following components:   Beta-Hydroxybutyric Acid 3.17 (*)    All other components within normal limits  CBG MONITORING, ED - Abnormal; Notable for the following components:   Glucose-Capillary 578 (*)    All other components within normal limits  I-STAT VENOUS BLOOD GAS, ED - Abnormal; Notable for the following components:   Sodium 125 (*)    Potassium 6.2 (*)    All other components within normal limits  CBG MONITORING, ED - Abnormal; Notable  for the following components:   Glucose-Capillary 434 (*)    All other components within normal limits  CBG MONITORING, ED - Abnormal; Notable for the following components:   Glucose-Capillary 371 (*)    All other components within normal limits  GROUP A STREP BY PCR  RESP PANEL  BY RT-PCR (FLU A&B, COVID) [id number]  CBC  LIPID PANEL  URINALYSIS, ROUTINE W REFLEX MICROSCOPIC  TROPONIN I (HIGH SENSITIVITY)  TROPONIN I (HIGH SENSITIVITY)    EKG EKG Interpretation  Date/Time:  Monday August 17 2021 10:19:56 EST Ventricular Rate:  111 PR Interval:  140 QRS Duration: 100 QT Interval:  346 QTC Calculation: 470 R Axis:   104 Text Interpretation: Sinus tachycardia Right atrial enlargement Pulmonary disease pattern Incomplete right bundle branch block Right ventricular hypertrophy Abnormal ECG Confirmed by Ernie Avena ([id number]) on 08/17/2021 3:43:19 PM  Radiology DG Chest 2 View  Result Date: 08/17/2021 CLINICAL DATA:  Chest pain. EXAM: CHEST - 2 VIEW COMPARISON:  November 10, 2012. FINDINGS: The heart size and mediastinal contours are within normal limits. Both lungs are clear. The visualized skeletal structures are unremarkable. IMPRESSION: No active cardiopulmonary disease. Electronically Signed   By: Lupita Raider M.D.   On: 08/17/2021 10:53    Procedures Procedures   Medications Ordered in ED Medications  dextrose 50 % solution 0-50 mL (has no administration in time range)  lactated ringers infusion (has no administration in time range)  lactated ringers bolus 1,000 mL (0 mLs Intravenous Stopped 08/17/21 1800)  insulin regular, human (MYXREDLIN) 100 units/ 100 mL infusion (16 Units/hr Intravenous New Bag/Given 08/17/21 1817)  fluconazole (DIFLUCAN) tablet 100 mg (has no administration in time range)  morphine 2 MG/ML injection 2 mg (has no administration in time range)  insulin glargine-yfgn (SEMGLEE) injection 8 Units (8 Units Subcutaneous Given 08/17/21 1644)  morphine 2 MG/ML  injection 2 mg (2 mg Intravenous Given 08/17/21 1642)  lidocaine (XYLOCAINE) 2 % viscous mouth solution 15 mL (15 mLs Mouth/Throat Given 08/17/21 1640)  nystatin (MYCOSTATIN) 100000 UNIT/ML suspension 500,000 Units (500,000 Units Mouth/Throat Given 08/17/21 1641)    ED Course  I have reviewed the triage vital signs and the nursing notes.  Pertinent labs & imaging results that were available during my care of the patient were reviewed by me and considered in my medical decision making (see chart for details).    MDM Rules/Calculators/A&P                         I discussed this case with my attending physician who cosigned this note including patient's presenting symptoms, physical exam, and planned diagnostics and interventions. Attending physician stated agreement with plan or made changes to plan which were implemented.   Patient with extremely poorly controlled diabetes, as well as recent respiratory infection.  Believe the patient's shortness of breath, throat pain, distress or secondary to developing diabetic ketoacidosis.  Patient reports that he has not had any medication for his diabetes in over 1 year.  Initial blood glucose 558.  Patient with pseudohyponatremia, corrected sodium at 133, still slightly hyponatremic.  We will administer 8 units insulin subcu, begin fluid bolus.  Additionally will administer viscous lidocaine, oral nystatin solution, as well as pain medicine as patient reports significant pain.  On reevaluation patient reports some improvement of pain in throat, but has symptoms ongoing.   Patient still with elevated blood sugar after 8 units subcu, fluid bolus, we will begin Endo tool at this time, especially as patient has elevated BHB, borderline gap, elevated bicarb suggesting that he is progressing towards DKA.  No acidosis noted at this time.  Patient with some improvement of throat pain after oral lidocaine, nystatin, however still reporting pain extending from back  of throat down esophagus, into stomach.  Worried the patient has progressed to esophageal thrush based on his symptoms.  As patient with multiple issues, need for close monitoring, concern for ongoing complications and oropharyngeal thrush, we will consult for admission at this time.  Hospitalist agrees for admission at this time. Final Clinical Impression(s) / ED Diagnoses Final diagnoses:  None    Rx / DC Orders ED Discharge Orders     None        [place] 08/17/21 1823    Pollyann Savoy, MD 08/17/21 2236

## 2021-08-17 NOTE — ED Provider Notes (Addendum)
Emergency Medicine Provider Triage Evaluation Note  Herbert Meyer , a 50 y.o. male  was evaluated in triage.  Pt complains of hiccups, lack of sleep, shortness of breath for the last several days. Was diagnosed with COVID 08/09/21. Taken decongestants, some other treatment for covid without relief. Endorses hx of diabetes.  Review of Systems  Positive: Shortness of breath, hiccups, pain in throat Negative: Stomach pain, nausea, vomiting  Physical Exam  BP 127/89 (BP Location: Left Arm)    Pulse (!) 111    Temp 98.9 F (37.2 C) (Oral)    Resp (!) 22    SpO2 97%  Gen:   Awake, in distress, yelling at staff Resp:  Normal effort, increased effort but good air movement in both lung fields MSK:   Moves extremities without difficulty  Other:  Frequent hiccups. Patient with white exudate in posterior oropharynx without significant tonsillar swelling, uvula deviation, or evidence of angioedema. No stridor  Medical Decision Making  Medically screening exam initiated at 11:30 AM.  Appropriate orders placed.  Gaurav Baldree was informed that the remainder of the evaluation will be completed by another provider, this initial triage assessment does not replace that evaluation, and the importance of remaining in the ED until their evaluation is complete.  Sob, hiccups   Olene Floss, [place] 08/17/21 1132  CBG performed at [address], PA-C 08/17/21 1149    Ernie Avena, MD 08/17/21 1215

## 2021-08-18 ENCOUNTER — Inpatient Hospital Stay (HOSPITAL_COMMUNITY): Payer: BC Managed Care – PPO

## 2021-08-18 ENCOUNTER — Other Ambulatory Visit: Payer: Self-pay

## 2021-08-18 ENCOUNTER — Encounter (HOSPITAL_COMMUNITY): Payer: Self-pay | Admitting: Family Medicine

## 2021-08-18 DIAGNOSIS — E1165 Type 2 diabetes mellitus with hyperglycemia: Secondary | ICD-10-CM

## 2021-08-18 DIAGNOSIS — U071 COVID-19: Secondary | ICD-10-CM | POA: Diagnosis present

## 2021-08-18 DIAGNOSIS — R825 Elevated urine levels of drugs, medicaments and biological substances: Secondary | ICD-10-CM | POA: Diagnosis present

## 2021-08-18 LAB — HEPATIC FUNCTION PANEL
ALT: 12 U/L (ref 0–44)
AST: 22 U/L (ref 15–41)
Albumin: 2.9 g/dL — ABNORMAL LOW (ref 3.5–5.0)
Alkaline Phosphatase: 62 U/L (ref 38–126)
Bilirubin, Direct: 0.6 mg/dL — ABNORMAL HIGH (ref 0.0–0.2)
Indirect Bilirubin: 0.9 mg/dL (ref 0.3–0.9)
Total Bilirubin: 1.5 mg/dL — ABNORMAL HIGH (ref 0.3–1.2)
Total Protein: 5.1 g/dL — ABNORMAL LOW (ref 6.5–8.1)

## 2021-08-18 LAB — GLUCOSE, CAPILLARY
Glucose-Capillary: 372 mg/dL — ABNORMAL HIGH (ref 70–99)
Glucose-Capillary: 389 mg/dL — ABNORMAL HIGH (ref 70–99)
Glucose-Capillary: 412 mg/dL — ABNORMAL HIGH (ref 70–99)

## 2021-08-18 LAB — BASIC METABOLIC PANEL
Anion gap: 11 (ref 5–15)
Anion gap: 7 (ref 5–15)
Anion gap: 12 (ref 5–15)
Anion gap: 9 (ref 5–15)
BUN: 24 mg/dL — ABNORMAL HIGH (ref 6–20)
BUN: 24 mg/dL — ABNORMAL HIGH (ref 6–20)
BUN: 24 mg/dL — ABNORMAL HIGH (ref 6–20)
BUN: 22 mg/dL — ABNORMAL HIGH (ref 6–20)
CO2: 19 mmol/L — ABNORMAL LOW (ref 22–32)
CO2: 21 mmol/L — ABNORMAL LOW (ref 22–32)
CO2: 18 mmol/L — ABNORMAL LOW (ref 22–32)
CO2: 18 mmol/L — ABNORMAL LOW (ref 22–32)
Calcium: 8.1 mg/dL — ABNORMAL LOW (ref 8.9–10.3)
Calcium: 7.5 mg/dL — ABNORMAL LOW (ref 8.9–10.3)
Calcium: 8.4 mg/dL — ABNORMAL LOW (ref 8.9–10.3)
Calcium: 8.1 mg/dL — ABNORMAL LOW (ref 8.9–10.3)
Chloride: 102 mmol/L (ref 98–111)
Chloride: 105 mmol/L (ref 98–111)
Chloride: 101 mmol/L (ref 98–111)
Chloride: 103 mmol/L (ref 98–111)
Creatinine, Ser: 0.88 mg/dL (ref 0.61–1.24)
Creatinine, Ser: 0.64 mg/dL (ref 0.61–1.24)
Creatinine, Ser: 0.78 mg/dL (ref 0.61–1.24)
Creatinine, Ser: 0.72 mg/dL (ref 0.61–1.24)
GFR, Estimated: >60 mL/min (ref >60)
GFR, Estimated: >60 mL/min (ref >60)
GFR, Estimated: >60 mL/min (ref >60)
GFR, Estimated: >60 mL/min (ref >60)
Glucose, Bld: 204 mg/dL — ABNORMAL HIGH (ref 70–99)
Glucose, Bld: 174 mg/dL — ABNORMAL HIGH (ref 70–99)
Glucose, Bld: 377 mg/dL — ABNORMAL HIGH (ref 70–99)
Glucose, Bld: 317 mg/dL — ABNORMAL HIGH (ref 70–99)
Potassium: 3.8 mmol/L (ref 3.5–5.1)
Potassium: 4.1 mmol/L (ref 3.5–5.1)
Potassium: 4.8 mmol/L (ref 3.5–5.1)
Potassium: 4.6 mmol/L (ref 3.5–5.1)
Sodium: 132 mmol/L — ABNORMAL LOW (ref 135–145)
Sodium: 133 mmol/L — ABNORMAL LOW (ref 135–145)
Sodium: 131 mmol/L — ABNORMAL LOW (ref 135–145)
Sodium: 130 mmol/L — ABNORMAL LOW (ref 135–145)

## 2021-08-18 LAB — CBC WITH DIFFERENTIAL/PLATELET
Abs Immature Granulocytes: 0.1 10*3/uL — ABNORMAL HIGH (ref 0.00–0.07)
Basophils Absolute: 0 10*3/uL (ref 0.0–0.1)
Basophils Relative: 0 %
Eosinophils Absolute: 0 10*3/uL (ref 0.0–0.5)
Eosinophils Relative: 0 %
Hemoglobin: 14.4 g/dL (ref 13.0–17.0)
Immature Granulocytes: 1 %
Lymphocytes Relative: 24 %
Lymphs Abs: 2.7 10*3/uL (ref 0.7–4.0)
Monocytes Absolute: 0.8 10*3/uL (ref 0.1–1.0)
Monocytes Relative: 7 %
Neutro Abs: 7.3 10*3/uL (ref 1.7–7.7)
Neutrophils Relative %: 68 %
Platelets: UNDETERMINED 10*3/uL (ref 150–400)
Smear Review: UNDETERMINED
WBC: 11 10*3/uL — ABNORMAL HIGH (ref 4.0–10.5)
nRBC: 0 % (ref 0.0–0.2)

## 2021-08-18 LAB — CBG MONITORING, ED
Glucose-Capillary: 189 mg/dL — ABNORMAL HIGH (ref 70–99)
Glucose-Capillary: 201 mg/dL — ABNORMAL HIGH (ref 70–99)
Glucose-Capillary: 274 mg/dL — ABNORMAL HIGH (ref 70–99)
Glucose-Capillary: 284 mg/dL — ABNORMAL HIGH (ref 70–99)
Glucose-Capillary: 328 mg/dL — ABNORMAL HIGH (ref 70–99)

## 2021-08-18 LAB — RESP PANEL BY RT-PCR (FLU A&B, COVID) ARPGX2
Influenza A by PCR: NEGATIVE
Influenza B by PCR: NEGATIVE
SARS Coronavirus 2 by RT PCR: POSITIVE — AB

## 2021-08-18 LAB — LACTIC ACID, PLASMA: Lactic Acid, Venous: 1.9 mmol/L (ref 0.5–1.9)

## 2021-08-18 LAB — TSH: TSH: 1.439 u[IU]/mL (ref 0.350–4.500)

## 2021-08-18 LAB — BETA-HYDROXYBUTYRIC ACID
Beta-Hydroxybutyric Acid: 0.35 mmol/L — ABNORMAL HIGH (ref 0.05–0.27)
Beta-Hydroxybutyric Acid: 1 mmol/L — ABNORMAL HIGH (ref 0.05–0.27)

## 2021-08-18 LAB — TRIGLYCERIDES: Triglycerides: 1821 mg/dL — ABNORMAL HIGH (ref <150)

## 2021-08-18 MED ORDER — LIDOCAINE VISCOUS HCL 2 % MT SOLN
15.0000 mL | Freq: Four times a day (QID) | OROMUCOSAL | Status: DC | PRN
  Filled 2021-08-18: qty 15

## 2021-08-18 MED ORDER — BACLOFEN 10 MG PO TABS
10.0000 mg | ORAL_TABLET | Freq: Three times a day (TID) | ORAL | Status: DC
  Administered 2021-08-18 – 2021-08-20 (×8): 10 mg via ORAL
  Filled 2021-08-18 (×9): qty 1

## 2021-08-18 MED ORDER — INSULIN ASPART 100 UNIT/ML IJ SOLN
0.0000 [IU] | Freq: Three times a day (TID) | INTRAMUSCULAR | Status: DC
  Administered 2021-08-18: 18:00:00 15 [IU] via SUBCUTANEOUS
  Administered 2021-08-18: 08:00:00 11 [IU] via SUBCUTANEOUS
  Administered 2021-08-18 – 2021-08-19 (×2): 15 [IU] via SUBCUTANEOUS
  Administered 2021-08-19 (×2): 11 [IU] via SUBCUTANEOUS
  Administered 2021-08-20: 09:00:00 15 [IU] via SUBCUTANEOUS

## 2021-08-18 MED ORDER — IOHEXOL 350 MG/ML SOLN
80.0000 mL | Freq: Once | INTRAVENOUS | Status: AC | PRN
  Administered 2021-08-18: 18:00:00 80 mL via INTRAVENOUS

## 2021-08-18 MED ORDER — FLUCONAZOLE 150 MG PO TABS
150.0000 mg | ORAL_TABLET | Freq: Every day | ORAL | Status: DC
  Administered 2021-08-18 – 2021-08-19 (×2): 150 mg via ORAL
  Filled 2021-08-18 (×2): qty 1

## 2021-08-18 MED ORDER — ACETAMINOPHEN 325 MG PO TABS
650.0000 mg | ORAL_TABLET | Freq: Four times a day (QID) | ORAL | Status: DC | PRN
  Administered 2021-08-19 – 2021-08-20 (×5): 650 mg via ORAL
  Filled 2021-08-18 (×5): qty 2

## 2021-08-18 MED ORDER — INSULIN ASPART 100 UNIT/ML IJ SOLN
0.0000 [IU] | Freq: Every day | INTRAMUSCULAR | Status: DC
  Administered 2021-08-18: 23:00:00 4 [IU] via SUBCUTANEOUS
  Administered 2021-08-19: 21:00:00 6 [IU] via SUBCUTANEOUS

## 2021-08-18 MED ORDER — INSULIN STARTER KIT- PEN NEEDLES (ENGLISH)
1.0000 | Freq: Once | Status: AC
  Administered 2021-08-18: 20:00:00 1
  Filled 2021-08-18: qty 1

## 2021-08-18 MED ORDER — LIDOCAINE VISCOUS HCL 2 % MT SOLN
15.0000 mL | Freq: Once | OROMUCOSAL | Status: AC
  Administered 2021-08-18: 09:00:00 15 mL via ORAL
  Filled 2021-08-18: qty 15

## 2021-08-18 MED ORDER — LIDOCAINE VISCOUS HCL 2 % MT SOLN
15.0000 mL | OROMUCOSAL | Status: DC | PRN
  Administered 2021-08-18: 15 mL via OROMUCOSAL
  Filled 2021-08-18: qty 15

## 2021-08-18 MED ORDER — ALUM & MAG HYDROXIDE-SIMETH 200-200-20 MG/5ML PO SUSP
30.0000 mL | Freq: Once | ORAL | Status: AC
  Administered 2021-08-18: 09:00:00 30 mL via ORAL
  Filled 2021-08-18: qty 30

## 2021-08-18 MED ORDER — ALUM & MAG HYDROXIDE-SIMETH 200-200-20 MG/5ML PO SUSP: 30.0000 mL | Freq: Four times a day (QID) | ORAL | Status: DC | PRN

## 2021-08-18 MED ORDER — OXYCODONE HCL 5 MG PO TABS
5.0000 mg | ORAL_TABLET | ORAL | Status: DC | PRN
  Administered 2021-08-18: 09:00:00 5 mg via ORAL
  Filled 2021-08-18: qty 1

## 2021-08-18 MED ORDER — IOHEXOL 9 MG/ML PO SOLN
ORAL | Status: AC
  Filled 2021-08-18: qty 1000

## 2021-08-18 NOTE — Progress Notes (Signed)
Patient arrived to room 5W12 in NAD, VS stable. Patient oriented to room and call bell in reach. Wife at bedside.

## 2021-08-18 NOTE — Progress Notes (Signed)
Family Medicine Teaching Service Daily Progress Note Intern Pager: [phone]  Patient name: Herbert Meyer Medical record number: [id number] Date of birth: 1971-06-20 Age: 50 y.o. Gender: male  Primary Care Provider: Wilmer Floor., MD Consultants: None Code Status: Full  Pt Overview and Major Events to Date:  12/20- admitted  Assessment and Plan: Mr. Herbert Meyer is a 50yo male who presented with SOB, abdominal pain, and intractable hiccups in the setting of recent COVID infection. Found to be in HHS also with concern for acute pancreatitis  HHS on admission   T2DM Transitioned off endotool last night around 0400 due to AG closed x2, briefly held down PO but declining food this am due to abdominal pain. Glucose this am 201>174>328. (Was as high as 578 on presentation.)  BHB 3.17>0.62>0.35. Was not taking any of his home medications, A1c 12.9%.  - Will d/c D5 fluids and transition to LR - Will give GI cocktail and oxycodone and see if patient able to eat  - Semglee 18u QHS, will likely need increased dose based on how much SAI he requires today - Will add mSSI TID AC + HS - Carb-modified diet  Abdominal Pain   Marked Triglyceridemia Concern for pancreatitis on admission Hiccups Remains with diffuse abdominal pain and tenderness morning. Baclofen  Abdominal ultrasound remarkable only for gallbladder sludge, visualized portion of the pancreas was unremarkable, no evidence of triglyceride hepatitis. Bilirubin up slightly to 1.5 tbili, 0.6 direct. Triglycerides this am 1308>6578. Amylase and lipase wnl at 61 and 46 respectively.  Patient unable to take food or fluids, limited by severe abdominal pain.  WBC 17.8>11.0. Hiccups inproved s/p protonix last night. Remain concerned for pancreatitis despite normal pancreatic enzymes given multiple risk factors (triglycerides + etOH) and persistent abdominal pain.  - CT abd/pelvis W Contrast  - Oxycodone 5mg  q4h PRN for pain - Will add Baclofen TID  for hiccups - Will need statin upon discharge  Thrush HIV panel negative. Remains with thrush of tongue on exam today. Suspect this is in the setting of his uncontrolled diabetes.  - Continue Nystatin rinse  COVID +  Day 9. Afebrile. Tachycardia that was present on admission is improving.  - Can d/c COVID precautions 12/22.   Polysubstance Abuse UDS positive for amphetamines though patient denies any amphetamine use since the year 2000. CIWA overnight 6 for headache. Last drink was two days ago, when he had a 12 pack of beer. AST/ALT WNL.  - Continue to trend CIWAs - Patient is high risk for withdrawals  FEN/GI: Carb-Modified PPx: Lovenox Dispo: Pending clinical improvement    Subjective:  Herbert Meyer continues to endorse 10/10 abdominal pain and 10/10 headache. He says that he has headaches at baseline but that this is worse. Of note, he has not had any   Objective: Temp:  [98.2 F (36.8 C)-98.9 F (37.2 C)] 98.2 F (36.8 C) (12/20 0307) Pulse Rate:  [82-111] 82 (12/20 0628) Resp:  [9-24] 20 (12/20 0445) BP: (117-166)/(70-118) 117/70 (12/20 0628) SpO2:  [92 %-100 %] 98 % (12/20 0445) Weight:  [75.8 kg] 75.8 kg (12/19 1814) Physical Exam: General: Uncomfortable but non-toxic appearing, lying in ED stretcher Cardiovascular: Regular rate and rhythm, no murmur Respiratory: Mildly tachypneic, shallow breathing, no wheezes, rales, or rhonchi Abdomen: Diffusely tender to shallow and deep palpation, no rebound/guarding, non-distended, no mass Extremities: Without edema, mild digital clubbing  Laboratory: Recent Labs  Lab 08/17/21 1042 08/17/21 1252 08/17/21 1632  WBC 14.0*  --  17.8*  HGB 17.3* 17.0  16.3  HCT RESULTS UNAVAILABLE DUE TO INTERFERING SUBSTANCE 50.0 43.7  PLT 374  --  405*   Recent Labs  Lab 08/17/21 2052 08/18/21 0022 08/18/21 0430  NA 131* 132* 133*  K 4.5 3.8 4.1  CL 99 102 105  CO2 22 19* 21*  BUN 25* 24* 24*  CREATININE 0.57* 0.88 0.64  CALCIUM  8.5* 8.1* 7.5*  PROT  --  5.1*  --   BILITOT  --  1.5*  --   ALKPHOS  --  62  --   ALT  --  12  --   AST  --  22  --   GLUCOSE 184* 204* 174*    Lab Results  Component Value Date   CHOL 348 (H) 08/17/2021   HDL 27 (L) 08/17/2021   LDLCALC UNABLE TO CALCULATE IF TRIGLYCERIDE OVER 400 mg/dL 40/03/6760   LDLDIRECT NOT CALCULATED 08/17/2021   TRIG 1,821 (H) 08/18/2021   CHOLHDL NOT REPORTED DUE TO HIGH TRIGLYCERIDES 08/17/2021   Lab Results  Component Value Date   LIPASE 46 08/17/2021   Lab Results  Component Value Date   AMYLASE 61 08/17/2021   Lab Results  Component Value Date   ALT 12 08/18/2021   AST 22 08/18/2021   ALKPHOS 62 08/18/2021   BILITOT 1.5 (H) 08/18/2021     Imaging/Diagnostic Tests: US Abdomen Complete CLINICAL DATA:  50 year old male with elevated direct bilirubin. Diabetes.  EXAM: ABDOMEN ULTRASOUND COMPLETE  COMPARISON:  [facility] abdomen ultrasound 10/23/2014.  FINDINGS: Gallbladder: Partially contracted gallbladder. Wall thickness is within normal limits. Small 2-3 mm focus of adherent sludge versus gallbladder polyp on image 20 appears inconsequential. No gallstone identified. No sonographic Murphy sign elicited.  Common bile duct: Diameter: 4-5 mm, normal.  Liver: No focal lesion identified. Within normal limits in parenchymal echogenicity. No intrahepatic biliary ductal dilatation. Portal vein is patent on color Doppler imaging with normal direction of blood flow towards the liver.  IVC: No abnormality visualized.  Pancreas: Visualized portion unremarkable.  Spleen: Size and appearance within normal limits.  Right Kidney: Length: 12.5 cm. Echogenicity within normal limits. No mass or hydronephrosis visualized.  Left Kidney: Length: 13.6 cm. Echogenicity within normal limits. No mass or hydronephrosis visualized.  Abdominal aorta: No aneurysm visualized.  Other findings: None.  IMPRESSION: 1. Questionable trace  sludge versus tiny gallbladder polyp (inconsequential). No cholelithiasis, and no evidence of acute cholecystitis or bile duct obstruction. 2. Otherwise the normal ultrasound appearance of the abdomen.  Electronically Signed   By: Odessa Fleming M.D.   On: 08/18/2021 06:51    Alicia Amel, MD 08/18/2021, 6:46 AM PGY-1, [facility] Family Medicine FPTS Intern pager: [phone], text pages welcome

## 2021-08-18 NOTE — Progress Notes (Addendum)
Inpatient Diabetes Program Recommendations  AACE/ADA: New Consensus Statement on Inpatient Glycemic Control (2015)  Target Ranges:  Prepandial:   less than 140 mg/dL      Peak postprandial:   less than 180 mg/dL (1-2 hours)      Critically ill patients:  140 - 180 mg/dL    Latest Reference Range & Units 08/17/21 18:12 08/17/21 18:42 08/17/21 19:41 08/17/21 20:45 08/17/21 22:34 08/17/21 23:49 08/18/21 00:58 08/18/21 01:51 08/18/21 02:52 08/18/21 04:09  Glucose-Capillary 70 - 99 mg/dL  8 units Semglee $RemoveBefor'@1644'hWDmumoJBgcZ$  371 (H)  IV Insulin Drip Started $RemoveBefo'@1817'miOAHTWGYST$  306 (H) 240 (H) 199 (H) 240 (H) 249 (H) 274 (H)  18 units Semglee $RemoveBefor'@0056'sBUehDjNRAQU$  284 (H) 189 (H) 201 (H)  IV Insulin Drip Stopped $RemoveBefo'@0438'YdfsolGOaer$     Latest Reference Range & Units 08/17/21 20:54  Hemoglobin A1C 4.8 - 5.6 % 12.9 (H)  (323 mg/dl)     To ED with SOB/ COVID + on 08/09/2021/ HHS  History: DM2   Home: Invokana 100 QD + Metformin 850 BID (NOT taking for over 1 year per MD notes)  Current Orders: Semglee 18 units QHS    MD- Note IV Insulin Drip stopped 4am today.  Please start Novolog Moderate Correction Scale/ SSI (0-15 units) TID AC + HS  Given current A1c of 12.9%, pt likely needs Insulin for home  Looks like Lantus and Novolog Insulin pens are covered by his insurance   Addendum 2:30pm--Met w/ pt at bedside with wife present (pt speaks fluent English--wife does not speak [place]).  Pt told me his father died back in 07-Feb-2023 and he went to [place] for several weeks and was not taking any of his meds for diabetes.  Came back to the [place] and then went back to [place] b/c his mother was not doing well.  Admits to not taking his meds and not taking care of his health.  Has CBG meter at home (has 3 meters) but not checking CBGs.  Has been seeing a PCP in [facility] (unsure of name) but needs to get established with new PCP.  TOC consult placed to help pt find new PCP.  Pt currently has [employer] with his job.  Spoke with patient about  his current A1c of 12.9%.  Explained what an A1c is and what it measures.  Reminded patient that his goal A1c is 7% or less per ADA standards to prevent both acute and long-term complications.  Explained to patient the extreme importance of good glucose control at home.  Encouraged patient to check his CBGs at least bid at home (fasting and another check within the day) and to record all CBGs in a logbook for his PCP to review.  Educated patient and spouse on insulin pen use at home.  Reviewed all steps of insulin pen including attachment of needle, 2-unit air shot, dialing up dose, giving injection, rotation of injection sites, removing needle, disposal of sharps, storage of unused insulin, disposal of insulin etc.  Patient able to provide successful return demonstration.  Reviewed troubleshooting with insulin pen.  Also reviewed Signs/Symptoms of Hypoglycemia with patient and how to treat Hypoglycemia at home.  Have asked RNs caring for patient to please allow patient to give all injections here in hospital as much as possible for practice.  MD to give patient Rxs for insulin pens and insulin pen needles.        --Will follow patient during hospitalization--  Wyn Quaker RN, MSN, CDE Diabetes Coordinator Inpatient Glycemic Control  Team Team Pager: [phone] (8a-5p)

## 2021-08-18 NOTE — Evaluation (Signed)
Physical Therapy Evaluation and Discharge Patient Details Name: Herbert Meyer MRN: [id number] DOB: 1971/06/09 Today's Date: 08/18/2021  History of Present Illness  50 y.o. male who presented 08/17/21 with SOB, intractable hiccups, abdominal pain, sore throat x5 days, and was found to be in Hyperosmolar hyperglycemic state (HHS)  likely 2/2 COVID infection and thrush.  PMHx of T2DM, recent COVID infection, hematochezia, PSA, and MDD  Clinical Impression   Patient evaluated by Physical Therapy with no further acute PT needs identified. Patient ambulated in room on room air with sats 96% throughout.  PT is signing off. Thank you for this referral.         Recommendations for follow up therapy are one component of a multi-disciplinary discharge planning process, led by the attending physician.  Recommendations may be updated based on patient status, additional functional criteria and insurance authorization.  Follow Up Recommendations No PT follow up    Assistance Recommended at Discharge None  Functional Status Assessment Patient has not had a recent decline in their functional status  Equipment Recommendations  None recommended by PT    Recommendations for Other Services       Precautions / Restrictions Precautions Precautions: None      Mobility  Bed Mobility Overal bed mobility: Independent                  Transfers Overall transfer level: Independent                      Ambulation/Gait Ambulation/Gait assistance: Modified independent (Device/Increase time) Gait Distance (Feet): 120 Feet Assistive device: None Gait Pattern/deviations: Step-through pattern;Decreased stride length;Antalgic;Wide base of support   Gait velocity interpretation: 1.31 - 2.62 ft/sec, indicative of limited community ambulator   General Gait Details: pt reporting pain in bil buttocks from recent shots and stiffness from limited activity x 8 days at home due to illness.  Walking slightly antalgic, but steady and slow.  Stairs            Wheelchair Mobility    Modified Rankin (Stroke Patients Only)       Balance Overall balance assessment: Independent                                           Pertinent Vitals/Pain Pain Assessment: Faces Faces Pain Scale: Hurts little more Pain Location: low back Pain Descriptors / Indicators: Aching Pain Intervention(s): Limited activity within patient's tolerance;Monitored during session    Home Living Family/patient expects to be discharged to:: Private residence Living Arrangements: Spouse/significant other;Children Available Help at Discharge: Family Type of Home: House Home Access: Stairs to enter Entrance Stairs-Rails: None Secretary/administrator of Steps: 6   Home Layout: One level Home Equipment: None (no longer has equipment from his back surgery)      Prior Function Prior Level of Function : Independent/Modified Independent                     Hand Dominance        Extremity/Trunk Assessment   Upper Extremity Assessment Upper Extremity Assessment: Overall WFL for tasks assessed    Lower Extremity Assessment Lower Extremity Assessment: Overall WFL for tasks assessed    Cervical / Trunk Assessment Cervical / Trunk Assessment: Normal  Communication   Communication: No difficulties  Cognition Arousal/Alertness: Awake/alert Behavior During Therapy: WFL for tasks assessed/performed Overall Cognitive Status:  Within Functional Limits for tasks assessed                                          General Comments General comments (skin integrity, edema, etc.): wife present    Exercises     Assessment/Plan    PT Assessment Patient does not need any further PT services  PT Problem List         PT Treatment Interventions      PT Goals (Current goals can be found in the Care Plan section)  Acute Rehab PT Goals PT Goal Formulation:  All assessment and education complete, DC therapy    Frequency     Barriers to discharge        Co-evaluation               AM-PAC PT "6 Clicks" Mobility  Outcome Measure Help needed turning from your back to your side while in a flat bed without using bedrails?: None Help needed moving from lying on your back to sitting on the side of a flat bed without using bedrails?: None Help needed moving to and from a bed to a chair (including a wheelchair)?: None Help needed standing up from a chair using your arms (e.g., wheelchair or bedside chair)?: None Help needed to walk in hospital room?: None Help needed climbing 3-5 steps with a railing? : None 6 Click Score: 24    End of Session   Activity Tolerance: Patient tolerated treatment well Patient left: in bed;with call bell/phone within reach;with family/visitor present Nurse Communication: Mobility status;Other (comment) (no further PT needs) PT Visit Diagnosis: Pain Pain - Right/Left:  (midline) Pain - part of body:  (back)    Time: 9417-4081 PT Time Calculation (min) (ACUTE ONLY): 12 min   Charges:   PT Evaluation $PT Eval Low Complexity: 1 Low           Jerolyn Center, PT Acute Rehabilitation Services  Pager [phone] Office [phone]   Zena Amos 08/18/2021, 3:38 PM

## 2021-08-18 NOTE — ED Notes (Signed)
If pt is able to keep his CBG less than 250 + being able to hold his food down, both for 1hr  then he will be switched off the IV insulin drip per Admitting MD

## 2021-08-18 NOTE — ED Notes (Addendum)
This RN added to secure chat with Merrily Brittle, DO by offgoing RN. In conversation, plan of care for pt regarding blood glucose levels and endotool discussed. Per provider, once pt has achieved CBG <250 x2 and able to tolerate PO intake with no nausea, endotool may be discontinued. This noted that pt has met criteria and provider contacted for orders to discontinue endotool. Orders given and executed by this RN. Pt stable and appears to tolerate well. Will continue to monitor.

## 2021-08-18 NOTE — ED Notes (Signed)
Pt able to hold down food and drink

## 2021-08-18 NOTE — Progress Notes (Signed)
°  Transition of Care [facility]) Screening Note   Patient Details  Name: Herbert Meyer Date of Birth: 27-May-1971   Transition of Care [facility]) CM/SW Contact:    Mearl Latin, LCSW Phone Number: 08/18/2021, 11:54 AM    Transition of Care Department [facility]) has reviewed patient and no TOC needs have been identified at this time. We will continue to monitor patient advancement through interdisciplinary progression rounds. If new patient transition needs arise, please place a TOC consult.

## 2021-08-18 NOTE — Discharge Instructions (Addendum)
Dear Herbert Meyer,  Thank you for letting us participate in your care. You were hospitalized for abdominal pain and difficulty swallowing and diagnosed with Hyperosmolar hyperglycemic state (HHS) (HCC) and oral candidiasis (thrush). You were treated with insulin for your diabetes and antifungal medicines for your thrush.   POST-HOSPITAL & CARE INSTRUCTIONS You should continue to take the Diflucan tablets and use the Nystatin mouth rinse until January 3rd, 2023. We do not expect this to be a recurrent issue for you.  We are starting you on 2 different medications for your cholesterol.  You should take these every day as your cholesterol levels puts you at increased risk for heart attacks, strokes, and acute pancreatitis. You will need to work closely with the primary care doctor to adjust your diabetic medication doses to prevent your sugars from becoming too high again.  Your current discharge dose is 30 units of Lantus twice daily.  Take these doses 12 hours apart.  You may require more insulin so keep a close track of your blood sugars and talk to your primary care provider if they remain elevated. Go to your follow up appointments (listed below)   DOCTOR'S APPOINTMENT   Future Appointments  Date Time Provider Department Center  09/28/2021  8:00 AM Rema Fendt, NP [facility] None    Follow-up Information     Paulina Fusi, MD. Go on 10/01/2021.   Specialty: Internal Medicine Why: Apt on 10/01/21 at 930. Bring photo ID, insurance card and list of mediations, Hospital follow up Contact information: [address] [place] [address] [phone]                 Take care and be well!  Family Medicine Teaching Service Inpatient Team [place]  [facility]  [address] [place], [place] [address] [phone]       Insulin Pen Instructions:  1. Remove Insulin pen cap and clean pen 1st with alcohol rub and then clean skin 2nd  with alcohol rub 2. Twist insulin pen needle onto pen (right tighty) 3. Remove outer cap and inner cap from needle 4. Dial pen to 2 units and perform prime- press pen to zero and make sure liquid (insulin) comes out of the needle 5. Dial pen to your dose and perform injection into your abdomen 6. Hold needle in skin for 10 seconds after injection 7. Remove needle from insulin pen and discard 8. Place cap back on insulin pen and store safely (at room temperature) 9. Store unused pens in refrigerator and can keep opened insulin pen at room temperature (discard used pen after 30 days)   You Tube Video on insulin pens at home:  [email or web address]    Symptoms of Hypoglycemia: Silly, Sweaty, Shaky Check sugar if you have your meter.  If near or less than 70 mg/dl, treat with 1/2 cup juice or soda or take glucose tablets Check sugar 15 minutes after treatment.  If sugar still near or less than 70 mg/al and symptomatic, treat again and may need a snack with some protein (peanut butter with crackers, etc)     Plate Method for Diabetes   Foods with carbohydrates make your blood glucose level go up. The plate method is a simple way to meal plan and control the amount of carbohydrate you eat.         Use the following guidance to build a healthy plate to control carbohydrates. Divide a 9-inch plate into 3 sections,  and consider your beverage the 4th section of your meal: Food Group Examples of Foods/Beverages for This Section of your Meal  Section 1: Non-starchy vegetables Fill  of your plate to include non-starchy vegetables Asparagus, broccoli, brussels sprouts, cabbage, carrots, cauliflower, celery, cucumber, green beans, mushrooms, peppers, salad greens, tomatoes, or zucchini.  Section 2: Protein foods Fill  of your plate to include a lean protein Lean meat, poultry, fish, seafood, cheese, eggs, lean deli meat, tofu, beans, lentils, nuts or nut butters.  Section  3: Carbohydrate foods Fill  of your plate to include carbohydrate foods Whole grains, whole wheat bread, brown rice, whole grain pasta, polenta, corn tortillas, fruit, or starchy vegetables (potatoes, green peas, corn, beans, acorn squash, and butternut squash). One cup of milk also counts as a food that contains carbohydrate.  Section 4: Beverage Choose water or a low-calorie drink for your beverage. Unsweetened tea, coffee, or flavored/sparkling water without added sugar.  Image reprinted with permission from The American Diabetes Association.  Copyright 2022 by the American Diabetes Association.   Copyright 2022  Academy of Nutrition and Dietetics. All rights reserved

## 2021-08-18 NOTE — Progress Notes (Signed)
FPTS Brief Progress Note  S: Sleeping. Nurse denies patient complaining of abdominal pain and he is eating his meal like normal   O: BP 137/89 (BP Location: Left Arm)    Pulse 88    Temp 98.1 F (36.7 C) (Oral)    Resp 19    Ht 5\' 3"  (1.6 m)    Wt 75.8 kg    SpO2 95%    BMI 29.60 kg/m   General: sleeping in bed. no acute distress. Age appropriate.  A/P: HHS on admission   T2DM - Orders reviewed. Labs for AM ordered, which was adjusted as needed.  - Monitor CBGs and PO intake  , DO 08/18/2021, 10:14 PM PGY-3, [place] [facility] Night Resident  Please page [phone] with questions.

## 2021-08-18 NOTE — ED Notes (Signed)
Ultrasound at beside with patient.

## 2021-08-18 NOTE — Progress Notes (Signed)
Initial Nutrition Assessment  DOCUMENTATION CODES:   Non-severe (moderate) malnutrition in context of chronic illness  INTERVENTION:   Encourage good PO intake Multivitamin w/ minerals daily Diet education  NUTRITION DIAGNOSIS:   Moderate Malnutrition related to chronic illness (Uncontrolled T2DM) as evidenced by mild fat depletion, moderate muscle depletion.  GOAL:   Patient will meet greater than or equal to 90% of their needs  MONITOR:   PO intake, Labs, Weight trends  REASON FOR ASSESSMENT:   Malnutrition Screening Tool    ASSESSMENT:   51 y.o. male presented to the ED  with shortness of breath, abdominal pain, intractable hiccups, and sore throat x 5 days. PMH includes T2DM and recent COVID +. Pt admitted with hyperosmolar hyperglycemic state, thrush, and COVID +.  12/13 symptoms started, hiccups started 12/15  Pt reports that he is hungry and wants to eat a lot, but if he eats too much he get sick and starts having discomfort in his esophagus. Pt reports that he works nights and that he has no routine in the way that he is eating. Pt reports that he eats 1-3 meals/day, biggest meal is typically when he gets home from work in the morning. Pt reports that there are times that he has to force himself to eat and there are times that he has to force himself to stop eating because he is eating a lot. Pt reports that he eats the most on the weekends when he is not working.   Pt reports that he does not do anything to control his diabetes. That he was on medicine and on insulin but stopped taking them in October of 2021.  Pt reports that he was ~ 222# a few years ago, now feels like he fluctuates between 175-220#. Pt states that on 12/13 at the dr visit he was 168# and then when he came here a week later was 157#. No weights within the past year recorded in EMR.  Pt denies any difficulty in ordering meals and reports that his wife goes to cafe and gets food.  RD discussed  using the "Plate Method for Diabetics" as an additional way to help control the amount of carbohydrates at meal times. Pt expressed good understanding of the information. RD added handout to discharge instruction.  Pt with no other questions or concerns at this time.   Medications reviewed and include: Fluconazole, SSI 0-15 units TID + 0-5 Units daily, Semglee 20 units daily, Protonix Labs reviewed:  - Sodium 132  - BUN 24 - Hgb A1c 12.9% - 24 hr BG trends   NUTRITION - FOCUSED PHYSICAL EXAM:  Flowsheet Row Most Recent Value  Orbital Region No depletion  Upper Arm Region Mild depletion  Thoracic and Lumbar Region Mild depletion  Buccal Region No depletion  Temple Region No depletion  Clavicle Bone Region Moderate depletion  Clavicle and Acromion Bone Region Moderate depletion  Scapular Bone Region Moderate depletion  Dorsal Hand No depletion  Patellar Region Mild depletion  Anterior Thigh Region Mild depletion  Posterior Calf Region Mild depletion  Edema (RD Assessment) None  Hair Reviewed  Eyes Reviewed  Mouth Reviewed  Skin Reviewed  Nails Reviewed       Diet Order:   Diet Order             Diet Carb Modified Fluid consistency: Thin; Room service appropriate? Yes  Diet effective now  EDUCATION NEEDS:   Education needs have been addressed  Skin:  Skin Assessment: Reviewed RN Assessment  Last BM:  No Documentation  Height:   Ht Readings from Last 1 Encounters:  08/17/21 5\' 3"  (1.6 m)    Weight:   Wt Readings from Last 1 Encounters:  08/18/21 75.8 kg    Ideal Body Weight:  56.4 kg  BMI:  Body mass index is 29.6 kg/m.  Estimated Nutritional Needs:   Kcal:  2200-2400  Protein:  110-125 grams  Fluid:  >/= 2.2 L    Jackob Crookston 08/20/21, RD, LDN Clinical Dietitian See [facility] for contact information.

## 2021-08-18 NOTE — Hospital Course (Addendum)
Brief Hospital Course  Hyperosmolar hyperglycemic state Patient presented to the [facility] on 12/19 with abdominal pain, SOB, polyuria, polydipsia, and intractable hiccups for five days. He has a known history of T2DM but has been off all medications for over a year. Glucose on arrival was 578. Beta-hydroxy butyrate was 3.17. UA showed >500 glucose and >80 ketnes. VBG showed pH 7.362, pCO2 WNL. Patient was started on Endotool and then transitioned to subcutaneous insulin once his anion gap was closed x2. He was able to take PO food, fluids, though his intake was limited by odynophagia.  Patient received long and short acting insulin.  He was uptitrated to 30 units of long-acting insulin twice daily with sliding scale coverage.  He was discharged in stable condition on 12/22 with instructions to take 30 units of Lantus twice daily.  Oral Thrush   Presumed Esophageal Candidiasis Patient was noted to have oral thrush on admission.  We presumed that his symptoms were secondary to a recent steroid injection received at urgent care in combination with his uncontrolled diabetes.  HIV and hep C testing were both negative.  He also complained of significant odynophagia that was limiting his p.o. intake.  We presumed that these symptoms were secondary to esophageal candidiasis.  He was treated with nystatin rinse and oral Diflucan for a total of 14 days.  Abdominal Pain   hypertriglyceridemia Patient arrived complaining of 10/10 abdominal pain.  Patient was afebrile though that he was tachycardic to 111 and tachypneic to the mid 20s.  Initial triglycerides obtained in the ED were 4330 raising concern for pancreatitis.  Amylase and lipase were both negative.  He had a slight direct hyperbilirubinemia to 1.5.  Initial abdominal examination was done with an ultrasound which showed some gallbladder sludge but no acute processes of the visualized portion of the pancreas or the liver.  Abdomen was further investigated with a CT  abdomen/pelvis which did not show evidence of acute intra-abdominal pathology.  Patient's pain rapidly improved with treatment of his hyperglycemia.  His triglycerides also improved from >4000 to <1000.  He was discharged on Crestor and fenofibrate.  COVID 19 Patient presented with known COVID-19 infection. He was on day 8 of illness on arrival. Respiratory status remained stable throughout visit.   Thrush Patient was noted to have oral thrush on presentation. He is not known to be immunocomprimised. Ginette Pitman was thought to be secondary to his uncontrolled T2DM, together with recent systemic steroid therapy prescribed by his PCP. HIV testing obtained during hospitalization was negative. He was treated with Nystatin oral rinse.   Items for PCP Follow-Up 1) Patient reports drinking 12-36 beers at a time on the weekend.  Recommend follow-up discussions regarding binge drinking. 2) Patient has been off of all chronic medications for greater than 1 year.  He will need close PCP follow-up to titrate his diabetic medications to prevent recurrence of HHS/DKA. 3) patient will need to continue nystatin rinse and p.o. Diflucan through September 01, 2021. 4) patient had marked hypertriglyceridemia on admission.  We started him on Crestor 20 mg daily and fenofibrate 108 mg daily.  Recommend follow-up lipid panel in 3 months and adjustment to lipid-lowering meds as needed.

## 2021-08-19 ENCOUNTER — Encounter (HOSPITAL_COMMUNITY): Payer: Self-pay | Admitting: Family Medicine

## 2021-08-19 DIAGNOSIS — I700 Atherosclerosis of aorta: Secondary | ICD-10-CM

## 2021-08-19 DIAGNOSIS — E440 Moderate protein-calorie malnutrition: Secondary | ICD-10-CM | POA: Insufficient documentation

## 2021-08-19 HISTORY — DX: Atherosclerosis of aorta: I70.0

## 2021-08-19 LAB — CBC WITH DIFFERENTIAL/PLATELET
Abs Immature Granulocytes: 0.15 10*3/uL — ABNORMAL HIGH (ref 0.00–0.07)
Basophils Absolute: 0 10*3/uL (ref 0.0–0.1)
Basophils Relative: 0 %
Eosinophils Absolute: 0.1 10*3/uL (ref 0.0–0.5)
Eosinophils Relative: 1 %
HCT: 39.2 % (ref 39.0–52.0)
Hemoglobin: 14.3 g/dL (ref 13.0–17.0)
Immature Granulocytes: 1 %
Lymphocytes Relative: 31 %
Lymphs Abs: 3.3 10*3/uL (ref 0.7–4.0)
MCH: 31.7 pg (ref 26.0–34.0)
MCHC: 36.5 g/dL — ABNORMAL HIGH (ref 30.0–36.0)
MCV: 86.9 fL (ref 80.0–100.0)
Monocytes Absolute: 0.7 10*3/uL (ref 0.1–1.0)
Monocytes Relative: 6 %
Neutro Abs: 6.4 10*3/uL (ref 1.7–7.7)
Neutrophils Relative %: 61 %
Platelets: 240 10*3/uL (ref 150–400)
RBC: 4.51 MIL/uL (ref 4.22–5.81)
RDW: 12.1 % (ref 11.5–15.5)
WBC: 10.6 10*3/uL — ABNORMAL HIGH (ref 4.0–10.5)
nRBC: 0 % (ref 0.0–0.2)

## 2021-08-19 LAB — GLUCOSE, CAPILLARY
Glucose-Capillary: 435 mg/dL — ABNORMAL HIGH (ref 70–99)
Glucose-Capillary: 331 mg/dL — ABNORMAL HIGH (ref 70–99)
Glucose-Capillary: 344 mg/dL — ABNORMAL HIGH (ref 70–99)
Glucose-Capillary: 428 mg/dL — ABNORMAL HIGH (ref 70–99)

## 2021-08-19 LAB — COMPREHENSIVE METABOLIC PANEL
ALT: 13 U/L (ref 0–44)
AST: 16 U/L (ref 15–41)
Albumin: 3.1 g/dL — ABNORMAL LOW (ref 3.5–5.0)
Alkaline Phosphatase: 87 U/L (ref 38–126)
Anion gap: 15 (ref 5–15)
BUN: 16 mg/dL (ref 6–20)
CO2: 15 mmol/L — ABNORMAL LOW (ref 22–32)
Calcium: 8.7 mg/dL — ABNORMAL LOW (ref 8.9–10.3)
Chloride: 102 mmol/L (ref 98–111)
Creatinine, Ser: 0.54 mg/dL — ABNORMAL LOW (ref 0.61–1.24)
GFR, Estimated: >60 mL/min (ref >60)
Glucose, Bld: 313 mg/dL — ABNORMAL HIGH (ref 70–99)
Potassium: 4.5 mmol/L (ref 3.5–5.1)
Sodium: 132 mmol/L — ABNORMAL LOW (ref 135–145)
Total Bilirubin: 0.7 mg/dL (ref 0.3–1.2)
Total Protein: 5.1 g/dL — ABNORMAL LOW (ref 6.5–8.1)

## 2021-08-19 LAB — HEPATITIS C ANTIBODY: HCV Ab: NONREACTIVE

## 2021-08-19 LAB — TRIGLYCERIDES: Triglycerides: 986 mg/dL — ABNORMAL HIGH (ref <150)

## 2021-08-19 MED ORDER — FENOFIBRATE 54 MG PO TABS
108.0000 mg | ORAL_TABLET | Freq: Every day | ORAL | Status: DC
  Administered 2021-08-19 – 2021-08-20 (×2): 108 mg via ORAL
  Filled 2021-08-19 (×2): qty 2

## 2021-08-19 MED ORDER — AZELASTINE HCL 0.1 % NA SOLN
1.0000 | Freq: Two times a day (BID) | NASAL | Status: DC
  Administered 2021-08-19 – 2021-08-20 (×3): 1 via NASAL
  Filled 2021-08-19: qty 30

## 2021-08-19 MED ORDER — CAMPHOR-MENTHOL 0.5-0.5 % EX LOTN
TOPICAL_LOTION | CUTANEOUS | Status: DC | PRN
  Filled 2021-08-19: qty 222

## 2021-08-19 MED ORDER — ADULT MULTIVITAMIN W/MINERALS CH
1.0000 | ORAL_TABLET | Freq: Every day | ORAL | Status: DC
  Administered 2021-08-19 – 2021-08-20 (×2): 1 via ORAL
  Filled 2021-08-19 (×2): qty 1

## 2021-08-19 MED ORDER — FLUCONAZOLE 150 MG PO TABS
150.0000 mg | ORAL_TABLET | Freq: Every day | ORAL | Status: DC
  Administered 2021-08-20: 09:00:00 150 mg via ORAL
  Filled 2021-08-19: qty 1

## 2021-08-19 MED ORDER — PANTOPRAZOLE SODIUM 40 MG PO TBEC
40.0000 mg | DELAYED_RELEASE_TABLET | Freq: Every day | ORAL | Status: DC
  Administered 2021-08-19 – 2021-08-20 (×2): 40 mg via ORAL
  Filled 2021-08-19 (×2): qty 1

## 2021-08-19 MED ORDER — INSULIN GLARGINE-YFGN 100 UNIT/ML ~~LOC~~ SOLN
20.0000 [IU] | Freq: Once | SUBCUTANEOUS | Status: AC
  Administered 2021-08-19: 09:00:00 20 [IU] via SUBCUTANEOUS
  Filled 2021-08-19: qty 0.2

## 2021-08-19 MED ORDER — INSULIN GLARGINE-YFGN 100 UNIT/ML ~~LOC~~ SOLN
20.0000 [IU] | Freq: Two times a day (BID) | SUBCUTANEOUS | Status: DC
  Administered 2021-08-19: 21:00:00 20 [IU] via SUBCUTANEOUS
  Filled 2021-08-19 (×2): qty 0.2

## 2021-08-19 MED ORDER — ROSUVASTATIN CALCIUM 20 MG PO TABS
20.0000 mg | ORAL_TABLET | Freq: Every day | ORAL | Status: DC
  Administered 2021-08-19 – 2021-08-20 (×2): 20 mg via ORAL
  Filled 2021-08-19 (×2): qty 1

## 2021-08-19 NOTE — Plan of Care (Signed)

## 2021-08-19 NOTE — Progress Notes (Signed)
FPTS Brief Progress Note  S: Sleeping. Per nurse, has been agitated but appropriate.    O: BP (!) 161/86    Pulse 98    Temp 98.3 F (36.8 C) (Oral)    Resp (!) 28    Ht 5\' 3"  (1.6 m)    Wt 75.8 kg    SpO2 96%    BMI 29.60 kg/m   General: Sleeping, no acute distress. Patient's wife at bedside Respiratory: normal effort  A/P: HHS on admission   T2DM Thrush   Odynophagia Abdominal Pain   Triglyceridemia COVID + - D/c'd CIWA precautions; agitation less likely from alcohol - CBG monitoring, 20 Units long acting given hs - Orders reviewed. Labs for AM ordered, which was adjusted as needed.   , DO 08/19/2021, 8:58 PM PGY-3,  Family Medicine Night Resident  Please page [phone] with questions.

## 2021-08-19 NOTE — Progress Notes (Addendum)
Family Medicine Teaching Service Daily Progress Note Intern Pager: [phone]  Patient name: Herbert Meyer Medical record number: [id number] Date of birth: [birth year] Age: 50 y.o. Gender: male  Primary Care Provider: Wilmer Floor., MD Consultants: None Code Status: Full  Pt Overview and Major Events to Date:  12/20- admitted  Assessment and Plan: Herbert Meyer is a 50yo male who presented with SOB and abdominal pain, found to be in HHS. He has a history of T2DM, MDD, recent COVID infection, etOH abuse, and tobacco use.   HHS on admission   T2DM Glucoses poorly controlled 313-412. BHB actually up yesterday 0.35>1.00. Received 18u LAI overnight and 45u SAI in past 24 hours.  - Will increase LAI to 20u BID - Continue CBGs, SSI  Thrush   Odynophagia Oral thrush improving with Nystatin rinse, however, patient continues to complain of odynophagia. Suspect that this may represent esophageal candidiasis. Moderately improved with Diflucan therapy. If he does not improve further, may require EGD to evaluate further. Awaiting Hep C result.  - Continue Nystatin rinse and Diflucan (Day 2/14) - Viscous lidocaine and Maalox cocktail prior to meals for comfort -Can discontinue IV fluids as p.o. intake is improved  Abdominal Pain   Triglyceridemia CT Abd/Pelv obtained yesterday with no obvious source for his abdominal pain. No evidence of pancreatitis. Pain is much improved today, suspect this was secondary to his HHS. Triglycerides [phone]. WBC 11.0>10.6.  - Will start Crestor 20mg  daily - Fenofibrate 108mg  daily - Can d/c oxycodone as patient does not need it  - Baclofen for hiccups  COVID + Day 10 of illness. Remains Afebrile. No respiratory distress. - Can D/c COVID precautions tomorrow  Polysubstance abuse CIWAs overnight 0. Last drink 3 days ago. Patient did endorse recent methamphetamine use, but does not wish to discuss further with his wife in the room. Denies ever using IV  drugs.  - Will return when his wife is not in the room to discuss drug use further  - Continue to monitor CIWA - Patient would likely benefit from medication assistance for AUD, consider naltrexone - Nicotine patch  FEN/GI: Carb-Modified PPx: Lovenox Dispo:Home in 2-3 days. Barriers include glycemic control  Subjective:  Mr. reports that his abdominal pain is much improved today.  He denies any nausea or vomiting.  His primary complaint at this time is odynophagia.  He also endorses that he does have a history of methamphetamine use but does not want to discuss this further right now as his wife is in the room.  He requests that I come back later in the day for a confidential conversation.  Objective: Temp:  [97.3 F (36.3 C)-99.3 F (37.4 C)] 97.3 F (36.3 C) (12/21 1148) Pulse Rate:  [84-101] 101 (12/21 1148) Resp:  [18-20] 20 (12/21 1148) BP: (115-147)/(58-92) 115/75 (12/21 1148) SpO2:  [95 %-98 %] 96 % (12/21 1148) Physical Exam: General: Sitting up in bed drinking tea, NAD Mouth: Tongue remains with oral thrush, improved from my previous exam Cardiovascular: Regular rate, regular rhythm, without murmur/rub/gallop Respiratory: Normal work of breathing on room air, no rhonchi or wheeze Abdomen: Nontender, nondistended, without mass or organomegaly Extremities: Without edema  Laboratory: Recent Labs  Lab 08/17/21 1632 08/18/21 0430 08/19/21 0154  WBC 17.8* 11.0* 10.6*  HGB 16.3 14.4 14.3  HCT 43.7 RESULTS UNAVAILABLE DUE TO INTERFERING SUBSTANCE 39.2  PLT 405* PLATELET CLUMPS NOTED ON SMEAR, UNABLE TO ESTIMATE 240   Recent Labs  Lab 08/18/21 0022 08/18/21 0430 08/18/21 0924 08/18/21 1329  08/19/21 0154  NA 132*   < > 131* 130* 132*  K 3.8   < > 4.8 4.6 4.5  CL 102   < > 101 103 102  CO2 19*   < > 18* 18* 15*  BUN 24*   < > 24* 22* 16  CREATININE 0.88   < > 0.78 0.72 0.54*  CALCIUM 8.1*   < > 8.4* 8.1* 8.7*  PROT 5.1*  --   --   --  5.1*  BILITOT 1.5*   --   --   --  0.7  ALKPHOS 62  --   --   --  87  ALT 12  --   --   --  13  AST 22  --   --   --  16  GLUCOSE 204*   < > 377* 317* 313*   < > = values in this interval not displayed.    Lab Results  Component Value Date   TRIG 986 (H) 08/19/2021   TRIG 1,821 (H) 08/18/2021   TRIG 4,330 (H) 08/17/2021     Imaging/Diagnostic Tests: CT ABDOMEN PELVIS W CONTRAST CLINICAL DATA:  Abdominal pain, acute, nonlocalized. COVID pneumonia  EXAM: CT ABDOMEN AND PELVIS WITH CONTRAST  TECHNIQUE: Multidetector CT imaging of the abdomen and pelvis was performed using the standard protocol following bolus administration of intravenous contrast.  CONTRAST:  90mL OMNIPAQUE IOHEXOL 350 MG/ML SOLN  COMPARISON:  None  FINDINGS: Lower chest: No acute abnormality.  Hepatobiliary: 15 cm hypodense lesion within the right hepatic dome is compatible with a benign cavernous hemangioma. The liver is otherwise unremarkable. Gallbladder unremarkable. No intra or extrahepatic biliary ductal dilation.  Pancreas: Unremarkable  Spleen: Unremarkable  Adrenals/Urinary Tract: Adrenal glands are unremarkable. Kidneys are normal, without renal calculi, focal lesion, or hydronephrosis. Bladder is unremarkable.  Stomach/Bowel: Stomach is within normal limits. Appendix appears normal. No evidence of bowel wall thickening, distention, or inflammatory changes.  Vascular/Lymphatic: Variant anatomy with a no lytic or blastic retroaortic left renal vein and duplicated inferior vena cava. Mild atherosclerotic calcification within the aortoiliac vasculature. No aortic aneurysm. No pathologic adenopathy within the abdomen and pelvis.  Reproductive: Prostate is unremarkable.  Other: No abdominal wall hernia or abnormality. No abdominopelvic ascites.  Musculoskeletal: No acute bone abnormality. L4-5 lumbar fusion with instrumentation and posterior decompression of L4 has been performed. No lytic or blastic  bone lesion.  IMPRESSION: No acute intra-abdominal pathology identified. No definite radiographic explanation for the patient's reported symptoms.  Aortic Atherosclerosis (ICD10-I70.0).  Electronically Signed   By: Helyn Numbers M.D.   On: 08/18/2021 20:02 US Abdomen Complete CLINICAL DATA:  50 year old male with elevated direct bilirubin. Diabetes.  EXAM: ABDOMEN ULTRASOUND COMPLETE  COMPARISON:  [facility] abdomen ultrasound 10/23/2014.  FINDINGS: Gallbladder: Partially contracted gallbladder. Wall thickness is within normal limits. Small 2-3 mm focus of adherent sludge versus gallbladder polyp on image 20 appears inconsequential. No gallstone identified. No sonographic Murphy sign elicited.  Common bile duct: Diameter: 4-5 mm, normal.  Liver: No focal lesion identified. Within normal limits in parenchymal echogenicity. No intrahepatic biliary ductal dilatation. Portal vein is patent on color Doppler imaging with normal direction of blood flow towards the liver.  IVC: No abnormality visualized.  Pancreas: Visualized portion unremarkable.  Spleen: Size and appearance within normal limits.  Right Kidney: Length: 12.5 cm. Echogenicity within normal limits. No mass or hydronephrosis visualized.  Left Kidney: Length: 13.6 cm. Echogenicity within normal limits. No mass or hydronephrosis visualized.  Abdominal aorta: No aneurysm visualized.  Other findings: None.  IMPRESSION: 1. Questionable trace sludge versus tiny gallbladder polyp (inconsequential). No cholelithiasis, and no evidence of acute cholecystitis or bile duct obstruction. 2. Otherwise the normal ultrasound appearance of the abdomen.  Electronically Signed   By: Odessa Fleming M.D.   On: 08/18/2021 06:51    Alicia Amel, MD 08/19/2021, 12:07 PM PGY-1, [facility] Family Medicine FPTS Intern pager: [phone], text pages welcome

## 2021-08-19 NOTE — TOC Progression Note (Addendum)
Transition of Care [facility]) - Progression Note    Patient Details  Name: Herbert Meyer MRN: [id number] Date of Birth: [birth year]  [facility] CM/SW Contact  Harriet Masson, RN Phone Number: 08/19/2021, 3:29 PM  Clinical Narrative:     Attempted to speak to patient. His wife states he is sleeping and she will have him call me back regarding his PCP  1720 Patient returned call regarding PCP. I have requested TOC assistant will make PCP appointment near patient's home and will add to the AVS. TOC will continue to follow for transition needs       Expected Discharge Plan and Services        home                                         Social Determinants of Health (SDOH) Interventions    Readmission Risk Interventions No flowsheet data found.

## 2021-08-19 NOTE — Therapy (Signed)
OT Cancellation Note  Patient Details Name: Herbert Meyer MRN: [id number] DOB: [birth year]   Cancelled Treatment:    Reason Eval/Treat Not Completed: Patient declined, no reason specified;Other (comment) (reports he is unable to keep his eyes open to participate after taking medication for a headache)  Daryl Eastern, OTR/L 08/19/2021, 1:54 PM

## 2021-08-20 ENCOUNTER — Other Ambulatory Visit (HOSPITAL_COMMUNITY): Payer: Self-pay

## 2021-08-20 LAB — CBC WITH DIFFERENTIAL/PLATELET
Abs Immature Granulocytes: 0.18 10*3/uL — ABNORMAL HIGH (ref 0.00–0.07)
Basophils Absolute: 0 10*3/uL (ref 0.0–0.1)
Basophils Relative: 0 %
Eosinophils Absolute: 0.1 10*3/uL (ref 0.0–0.5)
Eosinophils Relative: 1 %
HCT: 40.9 % (ref 39.0–52.0)
Hemoglobin: 15 g/dL (ref 13.0–17.0)
Immature Granulocytes: 2 %
Lymphocytes Relative: 33 %
Lymphs Abs: 3.4 10*3/uL (ref 0.7–4.0)
MCH: 32.5 pg (ref 26.0–34.0)
MCHC: 36.7 g/dL — ABNORMAL HIGH (ref 30.0–36.0)
MCV: 88.5 fL (ref 80.0–100.0)
Monocytes Absolute: 0.7 10*3/uL (ref 0.1–1.0)
Monocytes Relative: 7 %
Neutro Abs: 5.8 10*3/uL (ref 1.7–7.7)
Neutrophils Relative %: 57 %
Platelets: 265 10*3/uL (ref 150–400)
RBC: 4.62 MIL/uL (ref 4.22–5.81)
RDW: 12.3 % (ref 11.5–15.5)
WBC: 10.2 10*3/uL (ref 4.0–10.5)
nRBC: 0 % (ref 0.0–0.2)

## 2021-08-20 LAB — GLUCOSE, CAPILLARY
Glucose-Capillary: 442 mg/dL — ABNORMAL HIGH (ref 70–99)
Glucose-Capillary: 239 mg/dL — ABNORMAL HIGH (ref 70–99)
Glucose-Capillary: 309 mg/dL — ABNORMAL HIGH (ref 70–99)

## 2021-08-20 LAB — BASIC METABOLIC PANEL
Anion gap: 8 (ref 5–15)
BUN: 20 mg/dL (ref 6–20)
CO2: 24 mmol/L (ref 22–32)
Calcium: 9 mg/dL (ref 8.9–10.3)
Chloride: 99 mmol/L (ref 98–111)
Creatinine, Ser: 0.65 mg/dL (ref 0.61–1.24)
GFR, Estimated: >60 mL/min (ref >60)
Glucose, Bld: 440 mg/dL — ABNORMAL HIGH (ref 70–99)
Potassium: 4.7 mmol/L (ref 3.5–5.1)
Sodium: 131 mmol/L — ABNORMAL LOW (ref 135–145)

## 2021-08-20 MED ORDER — INSULIN ASPART 100 UNIT/ML IJ SOLN
0.0000 [IU] | Freq: Three times a day (TID) | INTRAMUSCULAR | Status: DC
  Administered 2021-08-20: 17:00:00 15 [IU] via SUBCUTANEOUS
  Administered 2021-08-20: 12:00:00 7 [IU] via SUBCUTANEOUS

## 2021-08-20 MED ORDER — NYSTATIN 100000 UNIT/ML MT SUSP
5.0000 mL | Freq: Four times a day (QID) | OROMUCOSAL | 0 refills | Status: AC
  Filled 2021-08-20: qty 60, 3d supply, fill #0

## 2021-08-20 MED ORDER — INSULIN GLARGINE-YFGN 100 UNIT/ML ~~LOC~~ SOPN
30.0000 [IU] | PEN_INJECTOR | Freq: Two times a day (BID) | SUBCUTANEOUS | 11 refills | Status: AC
Start: 2021-08-20 — End: ?

## 2021-08-20 MED ORDER — ACCU-CHEK GUIDE ME W/DEVICE KIT
PACK | 0 refills | Status: AC
  Filled 2021-08-20: qty 1, 1d supply, fill #0

## 2021-08-20 MED ORDER — ONETOUCH DELICA PLUS LANCET33G MISC
5 refills | Status: AC
  Filled 2021-08-20: qty 100, 20d supply, fill #0

## 2021-08-20 MED ORDER — LIDOCAINE 5 % EX PTCH: 1.0000 | MEDICATED_PATCH | CUTANEOUS | 0 refills | Status: AC

## 2021-08-20 MED ORDER — DAPAGLIFLOZIN PROPANEDIOL 10 MG PO TABS
10.0000 mg | ORAL_TABLET | Freq: Every day | ORAL | 0 refills | Status: DC
  Filled 2021-08-20: qty 30, 30d supply, fill #0

## 2021-08-20 MED ORDER — LIDOCAINE VISCOUS HCL 2 % MT SOLN
15.0000 mL | Freq: Four times a day (QID) | OROMUCOSAL | 0 refills | Status: AC | PRN
  Filled 2021-08-20: qty 100, 2d supply, fill #0

## 2021-08-20 MED ORDER — DAPAGLIFLOZIN PROPANEDIOL 10 MG PO TABS: 10.0000 mg | ORAL_TABLET | Freq: Every day | ORAL | 0 refills | Status: AC

## 2021-08-20 MED ORDER — ALUM & MAG HYDROXIDE-SIMETH 200-200-20 MG/5ML PO SUSP
30.0000 mL | Freq: Four times a day (QID) | ORAL | 0 refills | Status: AC | PRN
  Filled 2021-08-20: qty 355, 3d supply, fill #0

## 2021-08-20 MED ORDER — INSULIN GLARGINE-YFGN 100 UNIT/ML ~~LOC~~ SOLN
35.0000 [IU] | Freq: Two times a day (BID) | SUBCUTANEOUS | Status: DC
  Administered 2021-08-20: 09:00:00 35 [IU] via SUBCUTANEOUS
  Filled 2021-08-20 (×2): qty 0.35

## 2021-08-20 MED ORDER — CANAGLIFLOZIN 300 MG PO TABS
300.0000 mg | ORAL_TABLET | Freq: Every day | ORAL | 0 refills | Status: DC
  Filled 2021-08-20: qty 30, 30d supply, fill #0

## 2021-08-20 MED ORDER — AZELASTINE HCL 0.1 % NA SOLN
1.0000 | Freq: Two times a day (BID) | NASAL | 12 refills | Status: AC
  Filled 2021-08-20: qty 30, 90d supply, fill #0

## 2021-08-20 MED ORDER — FLUCONAZOLE 150 MG PO TABS
150.0000 mg | ORAL_TABLET | Freq: Every day | ORAL | 0 refills | Status: AC
Start: 2021-08-21 — End: 2021-09-01
  Filled 2021-08-20: qty 11, 11d supply, fill #0

## 2021-08-20 MED ORDER — INSULIN GLARGINE-YFGN 100 UNIT/ML ~~LOC~~ SOPN
30.0000 [IU] | PEN_INJECTOR | Freq: Two times a day (BID) | SUBCUTANEOUS | 11 refills | Status: DC
  Filled 2021-08-20: qty 15, 25d supply, fill #0

## 2021-08-20 MED ORDER — INSULIN ASPART 100 UNIT/ML IJ SOLN: 0.0000 [IU] | Freq: Every day | INTRAMUSCULAR | Status: DC

## 2021-08-20 MED ORDER — GLUCOSE BLOOD VI STRP
ORAL_STRIP | 12 refills | Status: AC
  Filled 2021-08-20: qty 100, 25d supply, fill #0

## 2021-08-20 MED ORDER — ROSUVASTATIN CALCIUM 20 MG PO TABS
20.0000 mg | ORAL_TABLET | Freq: Every day | ORAL | 0 refills | Status: AC
  Filled 2021-08-20: qty 30, 30d supply, fill #0

## 2021-08-20 MED ORDER — FENOFIBRATE 54 MG PO TABS
108.0000 mg | ORAL_TABLET | Freq: Every day | ORAL | 0 refills | Status: AC
  Filled 2021-08-20: qty 60, 30d supply, fill #0

## 2021-08-20 NOTE — Evaluation (Signed)
Occupational Therapy Evaluation Patient Details Name: Herbert Meyer MRN: [id number] DOB: 06-Aug-1971 Today's Date: 08/20/2021   History of Present Illness 50 y.o. male who presented 08/17/21 with SOB, intractable hiccups, abdominal pain, sore throat x5 days, and was found to be in Hyperosmolar hyperglycemic state (HHS)  likely 2/2 COVID infection and thrush.  PMHx of T2DM, recent COVID infection, hematochezia, PSA, and MDD   Clinical Impression   PTA patient independent and working. Admitted for above and presenting with problem list below, including generalized weakness, L shoulder/low back/hip pain, impaired balance and decreased activity tolerance. Patient currently requires min guard to supervision for mobility in room, but reaching out for UE support throughout session and reports feeling unsafe without UE support. Completing ADLs with supervision.  BP assessed sitting EOB at 138/89 and standing at 113/79, but pt unable to maintain standing for 3 minute assessment (sitting 128/74 when reading completed).  Pt voices he's dizzy with transitions from sitting to standing since admission. O2 and HR stable on RA.  Educated on energy conservation techniques, increased time with positional changes and safety.  Reports L shoulder "dislocation/break" 4 months ago, but hasn't mentioned it to the MD bc its not "related to" why hes here (COVID)--reports this still bothers him.  Will follow acutely.      Recommendations for follow up therapy are one component of a multi-disciplinary discharge planning process, led by the attending physician.  Recommendations may be updated based on patient status, additional functional criteria and insurance authorization.   Follow Up Recommendations  No OT follow up    Assistance Recommended at Discharge Intermittent Supervision/Assistance  Functional Status Assessment  Patient has had a recent decline in their functional status and demonstrates the ability to make  significant improvements in function in a reasonable and predictable amount of time.  Equipment Recommendations  Tub/shower seat    Recommendations for Other Services PT consult     Precautions / Restrictions Precautions Precautions: Fall Precaution Comments: watch BP Restrictions Weight Bearing Restrictions: No      Mobility Bed Mobility Overal bed mobility: Independent                  Transfers Overall transfer level: Needs assistance   Transfers: Sit to/from Stand Sit to Stand: Supervision           General transfer comment: for safety      Balance Overall balance assessment: Needs assistance Sitting-balance support: No upper extremity supported;Feet supported Sitting balance-Leahy Scale: Good     Standing balance support: No upper extremity supported;Single extremity supported;During functional activity Standing balance-Leahy Scale: Fair Standing balance comment: pt reaching out for UE support during mobility                           ADL either performed or assessed with clinical judgement   ADL Overall ADL's : Needs assistance/impaired     Grooming: Supervision/safety;Standing           Upper Body Dressing : Set up;Sitting   Lower Body Dressing: Supervision/safety;Sit to/from stand   Toilet Transfer: Min guard;Ambulation Toilet Transfer Details (indicate cue type and reason): pt reaching out for UE support, min guard for safety due to unsteadiness Toileting- Clothing Manipulation and Hygiene: Supervision/safety;Sit to/from stand       Functional mobility during ADLs: Min guard;Supervision/safety General ADL Comments: pt reports feeling dizzy during positional changes and unsteady with hip/back pain during [employer]  Assessment?: No apparent visual deficits     Perception     Praxis      Pertinent Vitals/Pain Pain Assessment: Faces Faces Pain Scale: Hurts little more Pain Location:  generalized Pain Descriptors / Indicators: Aching Pain Intervention(s): Limited activity within patient's tolerance;Monitored during session;Repositioned     Hand Dominance Right   Extremity/Trunk Assessment Upper Extremity Assessment Upper Extremity Assessment: LUE deficits/detail LUE Deficits / Details: reports L shoulder dislocation and break 4 months ago? decreased functional use overhead and reports "its still not right". LUE Coordination: decreased gross motor   Lower Extremity Assessment Lower Extremity Assessment: Defer to PT evaluation       Communication Communication Communication: No difficulties   Cognition Arousal/Alertness: Awake/alert Behavior During Therapy: WFL for tasks assessed/performed Overall Cognitive Status: Within Functional Limits for tasks assessed                                 General Comments: appears [facility]     General Comments  spouse present, educated on increased time with positional changes, energy conservation and safety    Exercises     Shoulder Instructions      Home Living Family/patient expects to be discharged to:: Private residence Living Arrangements: Spouse/significant other;Children Available Help at Discharge: Family Type of Home: House Home Access: Stairs to enter Secretary/administrator of Steps: 6 Entrance Stairs-Rails: None Home Layout: One level     Bathroom Shower/Tub: Chief Strategy Officer: Standard     Home Equipment: None          Prior Functioning/Environment Prior Level of Function : Independent/Modified Independent               ADLs Comments: works evenings (6pm to 330am)        OT Problem List: Decreased activity tolerance;Impaired balance (sitting and/or standing);Decreased safety awareness;Decreased knowledge of use of DME or AE;Decreased knowledge of precautions;Pain      OT Treatment/Interventions: Self-care/ADL training;Energy conservation;DME and/or AE  instruction;Therapeutic activities;Patient/family education;Balance training;Therapeutic exercise    OT Goals(Current goals can be found in the care plan section) Acute Rehab OT Goals Patient Stated Goal: to get better OT Goal Formulation: With patient Time For Goal Achievement: 09/03/21 Potential to Achieve Goals: Good  OT Frequency: Min 2X/week   Barriers to D/C:            Co-evaluation              AM-PAC OT "6 Clicks" Daily Activity     Outcome Measure Help from another person eating meals?: None Help from another person taking care of personal grooming?: A Little Help from another person toileting, which includes using toliet, bedpan, or urinal?: A Little Help from another person bathing (including washing, rinsing, drying)?: A Little Help from another person to put on and taking off regular upper body clothing?: A Little Help from another person to put on and taking off regular lower body clothing?: A Little 6 Click Score: 19   End of Session Nurse Communication: Mobility status;Other (comment) (BP drop, tolerance, L shoulder)  Activity Tolerance: Patient tolerated treatment well Patient left: in bed;with call bell/phone within reach;with family/visitor present  OT Visit Diagnosis: Other abnormalities of gait and mobility (R26.89);Muscle weakness (generalized) (M62.81)                Time: 4098-1191 OT Time Calculation (min): 28 min Charges:  OT General Charges $OT Visit: 1 Visit  OT Evaluation $OT Eval Moderate Complexity: 1 Mod OT Treatments $Self Care/Home Management : 8-22 mins  Barry Brunner, OT Acute Rehabilitation Services Pager [phone] Office [phone]   Chancy Milroy 08/20/2021, 10:44 AM

## 2021-08-20 NOTE — Evaluation (Addendum)
Physical Therapy Evaluation & Discharge Patient Details Name: Herbert Meyer MRN: [id number] DOB: April 17, 1971 Today's Date: 08/20/2021  History of Present Illness  Pt is a 50 y.o. male admitted 08/17/21 with SOB, intractable hiccups, abdominal pain, sore throat x5 days. Workup for hyperosmolar hyperglycemic state (HHS)  likely secondary to COVID infection and thrush.  PMH includes T2DM, recent COVID infection, hematochezia, PSA, MDD, back sxs.   Clinical Impression  Patient evaluated by Physical Therapy with no further acute PT needs identified. PTA, pt independent, working, lives with back pain. Today, pt mod indep with mobility, primarily limited by c/o back pain; although pt reports not wanting to focus on his back pain since "this is not why I'm here." Pt with inconsistent c/o dizziness with position changes, but demonstrates good awareness of compensatory strategies if feeling dizzy, as well as for his chronic back pain; difficult to determine pt's true concerns for acute PT ("I can get to the bathroom... I can do everything for myself"). Pt declines follow-up outpatient PT for back pain. All education has been completed and the patient has no further questions. Acute PT is signing off. Thank you for this referral.    Orthostatic BPs Supine 137/82  Sitting 134/89  Standing 132/81      Recommendations for follow up therapy are one component of a multi-disciplinary discharge planning process, led by the attending physician.  Recommendations may be updated based on patient status, additional functional criteria and insurance authorization.  Follow Up Recommendations No PT follow up    Assistance Recommended at Discharge PRN  Functional Status Assessment    Equipment Recommendations  None recommended by PT    Recommendations for Other Services       Precautions / Restrictions Precautions Precautions: Fall Precaution Comments: negative orthostatic hypotension this session; pt aware of  back precautions for comfort Restrictions Weight Bearing Restrictions: No      Mobility  Bed Mobility Overal bed mobility: Independent                  Transfers Overall transfer level: Independent Equipment used: None               General transfer comment: multiple sit<>stands from EOB indep; antalgic secondary to lower back pain    Ambulation/Gait                  Stairs            Wheelchair Mobility    Modified Rankin (Stroke Patients Only)       Balance Overall balance assessment: Needs assistance Sitting-balance support: No upper extremity supported;Feet supported Sitting balance-Leahy Scale: Good     Standing balance support: No upper extremity supported;Single extremity supported;During functional activity Standing balance-Leahy Scale: Fair Standing balance comment: pt reaching for UE support to stabilize secondary to back pain                             Pertinent Vitals/Pain Pain Assessment: Faces Faces Pain Scale: Hurts even more Pain Location: lower back Pain Descriptors / Indicators: Discomfort;Grimacing Pain Intervention(s): Monitored during session;Limited activity within patient's tolerance    Home Living Family/patient expects to be discharged to:: Private residence Living Arrangements: Spouse/significant other;Children Available Help at Discharge: Family;Available PRN/intermittently Type of Home: House Home Access: Stairs to enter Entrance Stairs-Rails: None Entrance Stairs-Number of Steps: 6   Home Layout: One level Home Equipment: None      Prior Function Prior Level  of Function : Independent/Modified Independent;Working/employed             Mobility Comments: Independent without DME; works evenings (6P-3:30A)       Hand Dominance   Dominant Hand: Right    Extremity/Trunk Assessment   Upper Extremity Assessment Upper Extremity Assessment: LUE deficits/detail LUE Deficits /  Details: reports L shoulder dislocation and break 4 months ago? decreased functional use overhead and reports "its still not right" LUE Coordination: decreased gross motor    Lower Extremity Assessment Lower Extremity Assessment: Overall WFL for tasks assessed    Cervical / Trunk Assessment Cervical / Trunk Assessment: Other exceptions Cervical / Trunk Exceptions: reports chronic back issues with h/o lumbar sx ~10 yrs prior  Communication   Communication: No difficulties  Cognition Arousal/Alertness: Awake/alert Behavior During Therapy: WFL for tasks assessed/performed Overall Cognitive Status: Within Functional Limits for tasks assessed                                 General Comments: WFL for majority of tasks; but pt 'talking in circles' regarding fact that his issue is back pain but then states "that's another issue, it has nothing to do with why I'm here" when asked if pt interested in additional physical therapy to address back issues        General Comments General comments (skin integrity, edema, etc.): Pt with inconsistent reports of dizziness with positional changes; negative orthostatic hypotension; describes dizziness as "feeling off balance." Pt initially stating main issue is back pain, but later stating "this is not why I'm here" and declined when asked if pt wanted physical therapy to address c/o back and shoulder pain. Pt reports primary concern is covid infection; educ on activity recommendations while recovering from COVID, including importance of frequent OOB mobility/ambulation. Difficult to determine pt's true concerns; pt in agreement he does not need acute PT or follow-up PT services ("I can do everything I need to do by myself"); seemingly frustrated with PT's questions, understandably frustrated by apparently being told by different doctors he would d/c on different days. Pt's wife present during session. Pt reports aware of strategies for overcoming  back pain; aware of strategies for overcoming dizziness that sometimes occurs "when standing too fast"    Exercises     Assessment/Plan    PT Assessment Patient does not need any further PT services  PT Problem List         PT Treatment Interventions      PT Goals (Current goals can be found in the Care Plan section)  Acute Rehab PT Goals PT Goal Formulation: All assessment and education complete, DC therapy    Frequency     Barriers to discharge        Co-evaluation               AM-PAC PT "6 Clicks" Mobility  Outcome Measure Help needed turning from your back to your side while in a flat bed without using bedrails?: None Help needed moving from lying on your back to sitting on the side of a flat bed without using bedrails?: None Help needed moving to and from a bed to a chair (including a wheelchair)?: None Help needed standing up from a chair using your arms (e.g., wheelchair or bedside chair)?: None Help needed to walk in hospital room?: None Help needed climbing 3-5 steps with a railing? : None 6 Click Score: 24  End of Session   Activity Tolerance: Patient tolerated treatment well;Patient limited by pain Patient left: in bed;with call bell/phone within reach;with family/visitor present Nurse Communication: Mobility status PT Visit Diagnosis: Pain    Time: 2482-5003 PT Time Calculation (min) (ACUTE ONLY): 29 min   Charges:   PT Evaluation $PT Eval Low Complexity: 1 Low        Ina Homes, PT, DPT Acute Rehabilitation Services  Pager [phone] Office [phone]  Malachy Chamber 08/20/2021, 4:49 PM

## 2021-08-20 NOTE — Progress Notes (Signed)
FPTS Interim Progress Note  Responded to call from intern regarding patient's dissatisfaction with having to leave the hospital today.  Upon entering the room the patient reports that he does not feel like he should be leaving the hospital because his blood sugars are still elevated up to 300.  We discussed that we have increased his insulin regimen and that he should be monitoring his blood sugars once he leaves and follow-up with the primary care provider regarding adjustments to his regimen.  He also reports that he still does not feel great and continues to have a headache and body aches which are most likely due to his COVID.  He did initially bring up that he did not have blood glucose monitoring supplies at home.  I was able to [facility] and get the supplies delivered to his room.  We also sent prescriptions for extra diabetes medications to his pharmacy in case he runs out prior to his follow-up appointment with his PCP.  Patient reports that he still does not think he should be discharged.  We will continue our discussion and acknowledge that he empathize that he does not feel well but his lab work is now stable, he has been evaluated by physical therapy and Occupational Therapy, and we developed a medication regiment for safe discharge.  Patient is understanding and is in agreement for discharge.  I discussed reasons why he should be reevaluated after discharge and patient agrees to this as well.  Derrel Nip, MD 08/20/2021, 6:33 PM PGY-3, [facility] Family Medicine Service pager [phone]

## 2021-08-20 NOTE — Progress Notes (Signed)
Family Medicine Teaching Service Daily Progress Note Intern Pager: [phone]  Patient name: Herbert Meyer Medical record number: [id number] Date of birth: 06/05/1971 Age: 50 y.o. Gender: male  Primary Care Provider: No primary care provider on file. Consultants: None Code Status: Full  Pt Overview and Major Events to Date:  12/20- admitted  Assessment and Plan: Herbert Meyer is a 50yo male who presented with SOB and abdominal pain, found to be in HHS. He has a history of T2DM, MDD, recent COVID infection, etOH abuse, and tobacco use.   HHS on admission   T2DM Increased Lantus to 20u BID yesterday,  glucoses remain poorly controlled overnight: 331>440. Received 43u SAI. Anion gap now closed 15>8. Patient will need close outpt follow-up but does not currently have a PCP. Without better glycemic control he is high risk for rapid recurrence of his HHS. TOC assisting with PCP needs. - Will increase LAI to 35u BID - Continue CBGs - Resistant Sliding Scale -If we are able to establish a safe follow-up plan, patient can discharge for outpatient insulin titration  Thrush   Odynophagia, suspected esophageal candidiasis Oral thrush improving. Odynophagia continues to limit PO intake. Hep C non-reactive.  If symptoms are persistent or worsening, may need GI consult. - Continue Nystatin rinse and Diflucan (Day 3/14) - Viscous lidocaine and Maalox cocktail prior to meals for comfort -Baclofen for hiccups  Hypertriglyceridemia - Continue Crestor 20mg  daily - Fenofibrate 108mg  daily - Will need close outpt follow-up  COVID 19 - Can discontinue precautions today  Polysubstance abuse No evidence of etOH withdrawals. CIWAs discontinued overnight. -Nicotine patch  FEN/GI: Carb-modified PPx: Lovenox Dispo:Home today vs tomorrow. Barriers include glycemic control and PCP needs.   Subjective:  Herbert Meyer reports feeling "a little better" today.  His odynophagia continues to be his primary  complaint.  He does say that the viscous lidocaine and Maalox cocktails before meals is helpful in managing the symptoms.  Objective: Temp:  [97.3 F (36.3 C)-98.4 F (36.9 C)] 98 F (36.7 C) (12/22 0755) Pulse Rate:  [85-101] 99 (12/22 0755) Resp:  [15-24] 20 (12/22 0755) BP: (115-161)/(73-98) 149/73 (12/22 0755) SpO2:  [95 %-99 %] 95 % (12/22 0755) Physical Exam: General: Sitting up in bed eating breakfast, NAD Mouth: Oral thrush persistent but improved Cardiovascular: Regular rate, regular rhythm, no murmur/rub/gallop Respiratory: Normal work of breathing on room air, no wheezes/rales/rhonchi Abdomen: Nontender, nondistended, no organomegaly Extremities: Without edema or deformity  Laboratory: Recent Labs  Lab 08/18/21 0430 08/19/21 0154 08/20/21 0118  WBC 11.0* 10.6* 10.2  HGB 14.4 14.3 15.0  HCT RESULTS UNAVAILABLE DUE TO INTERFERING SUBSTANCE 39.2 40.9  PLT PLATELET CLUMPS NOTED ON SMEAR, UNABLE TO ESTIMATE 240 265   Recent Labs  Lab 08/18/21 0022 08/18/21 0430 08/18/21 1329 08/19/21 0154 08/20/21 0118  NA 132*   < > 130* 132* 131*  K 3.8   < > 4.6 4.5 4.7  CL 102   < > 103 102 99  CO2 19*   < > 18* 15* 24  BUN 24*   < > 22* 16 20  CREATININE 0.88   < > 0.72 0.54* 0.65  CALCIUM 8.1*   < > 8.1* 8.7* 9.0  PROT 5.1*  --   --  5.1*  --   BILITOT 1.5*  --   --  0.7  --   ALKPHOS 62  --   --  87  --   ALT 12  --   --  13  --  AST 22  --   --  16  --   GLUCOSE 204*   < > 317* 313* 440*   < > = values in this interval not displayed.    Imaging/Diagnostic Tests: No new imaging, tests  Alicia Amel, MD 08/20/2021, 11:17 AM PGY-1, [facility] Family Medicine FPTS Intern pager: [phone], text pages welcome

## 2021-08-20 NOTE — Discharge Summary (Signed)
Family Medicine Teaching [facility] Discharge Summary  Patient name: Herbert Meyer Medical record number: [id number] Date of birth: 03-Feb-1971 Age: 50 y.o. Gender: male Date of Admission: 08/17/2021  Date of Discharge: 08/20/2021 Admitting Physician: Leighton Roach McDiarmid, MD  Primary Care Provider: No primary care provider on file. Consultants: None  Indication for Hospitalization: HHS  Discharge Diagnoses/Problem List:  HHS on admission/T2DM Thrush Odynophagia Hypertriglyceridemia COVID-19 Polysubstance abuse  Disposition: Home  Discharge Condition: Stable, improved  Discharge Exam: Per Dr. Lily Lovings evaluation on 08/20/21 Temp:  [97.3 F (36.3 C)-98.4 F (36.9 C)] 98 F (36.7 C) (12/22 0755) Pulse Rate:  [85-101] 99 (12/22 0755) Resp:  [15-24] 20 (12/22 0755) BP: (115-161)/(73-98) 149/73 (12/22 0755) SpO2:  [95 %-99 %] 95 % (12/22 0755) Physical Exam: General: Sitting up in bed eating breakfast, NAD Mouth: Oral thrush persistent but improved Cardiovascular: Regular rate, regular rhythm, no murmur/rub/gallop Respiratory: Normal work of breathing on room air, no wheezes/rales/rhonchi Abdomen: Nontender, nondistended, no organomegaly Extremities: Without edema or deformity  Brief Hospital Course  Hyperosmolar hyperglycemic state Patient presented to the [facility] on 12/19 with abdominal pain, SOB, polyuria, polydipsia, and intractable hiccups for five days. He has a known history of T2DM but has been off all medications for over a year. Glucose on arrival was 578. Beta-hydroxy butyrate was 3.17. UA showed >500 glucose and >80 ketnes. VBG showed pH 7.362, pCO2 WNL. Patient was started on Endotool and then transitioned to subcutaneous insulin once his anion gap was closed x2. He was able to take PO food, fluids, though his intake was limited by odynophagia.  Patient received long and short acting insulin.  He was uptitrated to 30 units of long-acting insulin twice daily with  sliding scale coverage.  He was discharged in stable condition on 12/22 with instructions to take 30 units of Lantus twice daily.  Oral Thrush   Presumed Esophageal Candidiasis Patient was noted to have oral thrush on admission.  We presumed that his symptoms were secondary to a recent steroid injection received at urgent care in combination with his uncontrolled diabetes.  HIV and hep C testing were both negative.  He also complained of significant odynophagia that was limiting his p.o. intake.  We presumed that these symptoms were secondary to esophageal candidiasis.  He was treated with nystatin rinse and oral Diflucan for a total of 14 days.  Abdominal Pain   hypertriglyceridemia Patient arrived complaining of 10/10 abdominal pain.  Patient was afebrile though that he was tachycardic to 111 and tachypneic to the mid 20s.  Initial triglycerides obtained in the ED were 4330 raising concern for pancreatitis.  Amylase and lipase were both negative.  He had a slight direct hyperbilirubinemia to 1.5.  Initial abdominal examination was done with an ultrasound which showed some gallbladder sludge but no acute processes of the visualized portion of the pancreas or the liver.  Abdomen was further investigated with a CT abdomen/pelvis which did not show evidence of acute intra-abdominal pathology.  Patient's pain rapidly improved with treatment of his hyperglycemia.  His triglycerides also improved from >4000 to <1000.  He was discharged on Crestor and fenofibrate.  COVID 19 Patient presented with known COVID-19 infection. He was on day 8 of illness on arrival. Respiratory status remained stable throughout visit.   Thrush Patient was noted to have oral thrush on presentation. He is not known to be immunocomprimised. Ginette Pitman was thought to be secondary to his uncontrolled T2DM, together with recent systemic steroid therapy prescribed by his PCP.  HIV testing obtained during hospitalization was negative. He was  treated with Nystatin oral rinse.   Items for PCP Follow-Up 1) Patient reports drinking 12-36 beers at a time on the weekend.  Recommend follow-up discussions regarding binge drinking. 2) Patient has been off of all chronic medications for greater than 1 year.  He will need close PCP follow-up to titrate his diabetic medications to prevent recurrence of HHS/DKA. 3) patient will need to continue nystatin rinse and p.o. Diflucan through September 01, 2021. 4) patient had marked hypertriglyceridemia on admission.  We started him on Crestor 20 mg daily and fenofibrate 108 mg daily.  Recommend follow-up lipid panel in 3 months and adjustment to lipid-lowering meds as needed.   Significant Procedures: None  Significant Labs and Imaging:  Recent Labs  Lab 08/18/21 0430 08/19/21 0154 08/20/21 0118  WBC 11.0* 10.6* 10.2  HGB 14.4 14.3 15.0  HCT RESULTS UNAVAILABLE DUE TO INTERFERING SUBSTANCE 39.2 40.9  PLT PLATELET CLUMPS NOTED ON SMEAR, UNABLE TO ESTIMATE 240 265   Recent Labs  Lab 08/17/21 2052 08/18/21 0022 08/18/21 0430 08/18/21 0924 08/18/21 1329 08/19/21 0154 08/20/21 0118  NA 131* 132* 133* 131* 130* 132* 131*  K 4.5 3.8 4.1 4.8 4.6 4.5 4.7  CL 99 102 105 101 103 102 99  CO2 22 19* 21* 18* 18* 15* 24  GLUCOSE 184* 204* 174* 377* 317* 313* 440*  BUN 25* 24* 24* 24* 22* 16 20  CREATININE 0.57* 0.88 0.64 0.78 0.72 0.54* 0.65  CALCIUM 8.5* 8.1* 7.5* 8.4* 8.1* 8.7* 9.0  MG 2.2  --   --   --   --   --   --   ALKPHOS  --  62  --   --   --  87  --   AST  --  22  --   --   --  16  --   ALT  --  12  --   --   --  13  --   ALBUMIN  --  2.9*  --   --   --  3.1*  --     CT ABDOMEN PELVIS W CONTRAST  Result Date: 08/18/2021 CLINICAL DATA:  Abdominal pain, acute, nonlocalized. COVID pneumonia EXAM: CT ABDOMEN AND PELVIS WITH CONTRAST TECHNIQUE: Multidetector CT imaging of the abdomen and pelvis was performed using the standard protocol following bolus administration of intravenous  contrast. CONTRAST:  5mL OMNIPAQUE IOHEXOL 350 MG/ML SOLN COMPARISON:  None FINDINGS: Lower chest: No acute abnormality. Hepatobiliary: 15 cm hypodense lesion within the right hepatic dome is compatible with a benign cavernous hemangioma. The liver is otherwise unremarkable. Gallbladder unremarkable. No intra or extrahepatic biliary ductal dilation. Pancreas: Unremarkable Spleen: Unremarkable Adrenals/Urinary Tract: Adrenal glands are unremarkable. Kidneys are normal, without renal calculi, focal lesion, or hydronephrosis. Bladder is unremarkable. Stomach/Bowel: Stomach is within normal limits. Appendix appears normal. No evidence of bowel wall thickening, distention, or inflammatory changes. Vascular/Lymphatic: Variant anatomy with a no lytic or blastic retroaortic left renal vein and duplicated inferior vena cava. Mild atherosclerotic calcification within the aortoiliac vasculature. No aortic aneurysm. No pathologic adenopathy within the abdomen and pelvis. Reproductive: Prostate is unremarkable. Other: No abdominal wall hernia or abnormality. No abdominopelvic ascites. Musculoskeletal: No acute bone abnormality. L4-5 lumbar fusion with instrumentation and posterior decompression of L4 has been performed. No lytic or blastic bone lesion. IMPRESSION: No acute intra-abdominal pathology identified. No definite radiographic explanation for the patient's reported symptoms. Aortic Atherosclerosis (ICD10-I70.0). Electronically Signed  By: Helyn Numbers M.D.   On: 08/18/2021 20:02     Results/Tests Pending at Time of Discharge: None   Discharge Medications:  Allergies as of 08/20/2021   No Known Allergies      Medication List     STOP taking these medications    canagliflozin 100 MG Tabs tablet Commonly known as: INVOKANA   DSS 100 MG Caps   methocarbamol 500 MG tablet Commonly known as: ROBAXIN   oxyCODONE-acetaminophen 5-325 MG tablet Commonly known as: PERCOCET/ROXICET       TAKE these  medications    acetaminophen 325 MG tablet Commonly known as: TYLENOL Take 650 mg by mouth every 6 (six) hours as needed.   alum & mag hydroxide-simeth 200-200-20 MG/5ML suspension Commonly known as: MAALOX/MYLANTA Take 30 mLs by mouth every 6 (six) hours as needed for indigestion or heartburn.   azelastine 0.1 % nasal spray Commonly known as: ASTELIN Place 1 spray into both nostrils 2 (two) times daily. Use in each nostril as directed   dapagliflozin propanediol 10 MG Tabs tablet Commonly known as: FARXIGA Take 1 tablet (10 mg total) by mouth daily before breakfast.   fenofibrate 54 MG tablet Take 2 tablets (108 mg total) by mouth daily. Start taking on: August 21, 2021   fluconazole 150 MG tablet Commonly known as: DIFLUCAN Take 1 tablet (150 mg total) by mouth daily for 11 days. Start taking on: August 21, 2021   insulin glargine-yfgn 100 UNIT/ML Pen Commonly known as: SEMGLEE Inject 30 Units into the skin 2 (two) times daily.   lidocaine 2 % solution Commonly known as: XYLOCAINE Use as directed 15 mLs in the mouth or throat every 6 (six) hours as needed for mouth pain.   lidocaine 5 % Commonly known as: LIDODERM Place 1 patch onto the skin daily. Remove & Discard patch within 12 hours or as directed by MD   nystatin 100000 UNIT/ML suspension Commonly known as: MYCOSTATIN Use as directed 5 mLs (500,000 Units total) in the mouth or throat 4 (four) times daily.   rosuvastatin 20 MG tablet Commonly known as: CRESTOR Take 1 tablet (20 mg total) by mouth daily. Start taking on: August 21, 2021        Discharge Instructions: Please refer to Patient Instructions section of EMR for full details.  Patient was counseled important signs and symptoms that should prompt return to medical care, changes in medications, dietary instructions, activity restrictions, and follow up appointments.   Follow-Up Appointments:  Follow-up Information     Rema Fendt, NP  Follow up.   Specialty: Nurse Practitioner Why: TIME : 8:00 AM DATE: JANUARY 30 ,2023 DOCTORJeannett Senior, AMY Contact information: [address] [place] [address] [phone]                 Derrel Nip, MD 08/20/2021, 4:34 PM PGY-3, [facility] Family Medicine

## 2021-08-20 NOTE — Progress Notes (Signed)
Discharge instruction given to patient, including follow up appointment, medications, home care etc. Pt verbalized of understanding.

## 2021-08-20 NOTE — Plan of Care (Signed)

## 2021-08-20 NOTE — Progress Notes (Addendum)
PM Update:   Pt has PCP appt scheduled for January 30 for close hospital follow up given his hyperglycemia. Occupational therapy has recommended a re-evaluation with PT given some dizziness, pain with walking, and left shoulder pain. Called PT about imminent discharge, and they are to see him as soon as possible. Otherwise, pt is medically stable for discharge pending PT evaluation and close follow up outpatient.

## 2021-08-20 NOTE — Progress Notes (Signed)
FPTS Brief Progress Note  S: Called to bedside by nursing reporting that the patient did not want to go home and was upset that someone had not spoken to him about leaving today.  Went to bedside and spoke to the patient.  He reports of a chronic headache and dizziness that has been evaluated by PT and OT.  He is COVID-positive and thinks that this could be related.  He also is concerned about checking his blood sugars at home because they have been higher since he has been here.  O: BP 138/78 (BP Location: Left Arm)    Pulse 99    Temp 98.1 F (36.7 C) (Oral)    Resp 18    Ht 5\' 3"  (1.6 m)    Wt 75.8 kg    SpO2 97%    BMI 29.60 kg/m    A/P: I explained in depth to the patient that he likely has this dizziness and headache because he is COVID-positive.  He will definitely feel sick still from COVID for a few days.  However, this is not an indication to keep the patient, and he is medically stable for discharge.  We will provide the patient with sufficient insulin and strips to check his sugar regularly. He had insulin sent from [facility] and we have sent more to his outside pharmacy to have in case he needs it. He will follow up with his PCP outpatient.  [facility], MD 08/20/2021, 5:05 PM PGY-1, [facility] Family Medicine Resident  Please page [phone] with questions.

## 2021-09-23 NOTE — Progress Notes (Signed)
Erroneous encounter

## 2021-09-24 ENCOUNTER — Ambulatory Visit: Payer: BC Managed Care – PPO | Admitting: Family Medicine

## 2021-09-28 ENCOUNTER — Encounter: Payer: BC Managed Care – PPO | Admitting: Family

## 2021-09-28 DIAGNOSIS — F1990 Other psychoactive substance use, unspecified, uncomplicated: Secondary | ICD-10-CM

## 2021-09-28 DIAGNOSIS — Z794 Long term (current) use of insulin: Secondary | ICD-10-CM

## 2022-09-21 ENCOUNTER — Other Ambulatory Visit: Payer: Self-pay

## 2022-10-15 IMAGING — US US ABDOMEN COMPLETE
1 series · 13 of 25 positions shown · non-contrast
Comparison: Afea Ulzen abdomen ultrasound 10/23/2014.

CLINICAL DATA: 50-year-old male with elevated direct bilirubin.
Diabetes.

EXAM:
ABDOMEN ULTRASOUND COMPLETE

[Series 1: us abdomen complete · 13 of 118 slices shown]
[im 1/118]
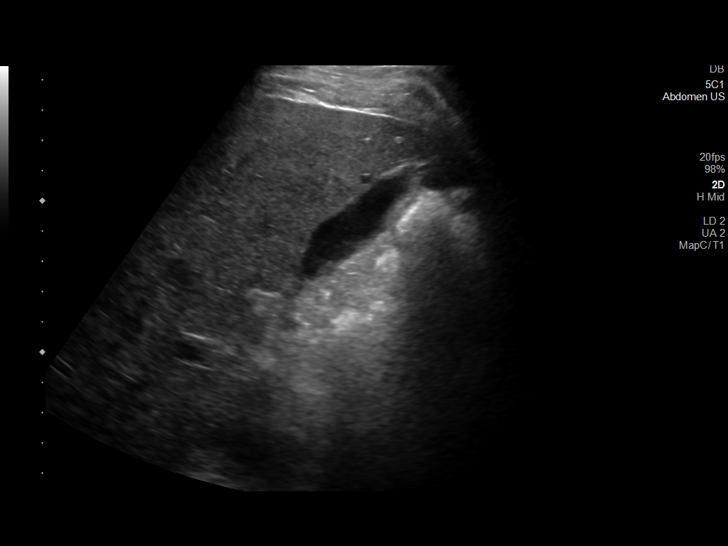
[im 10/118]
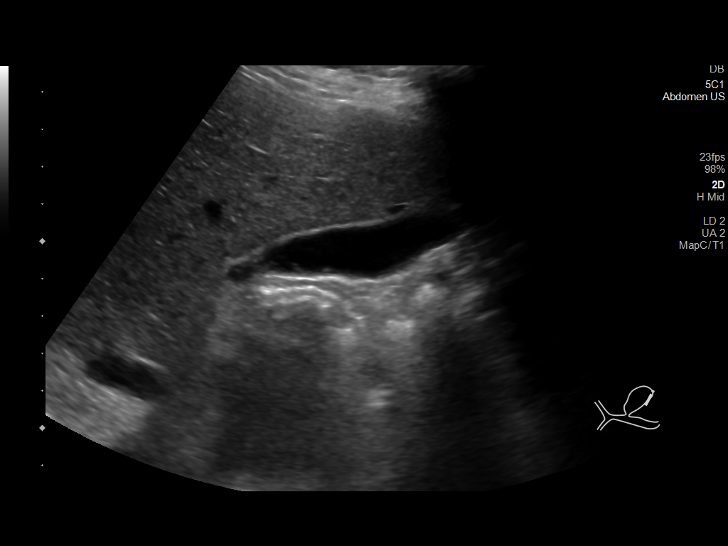
[im 20/118]
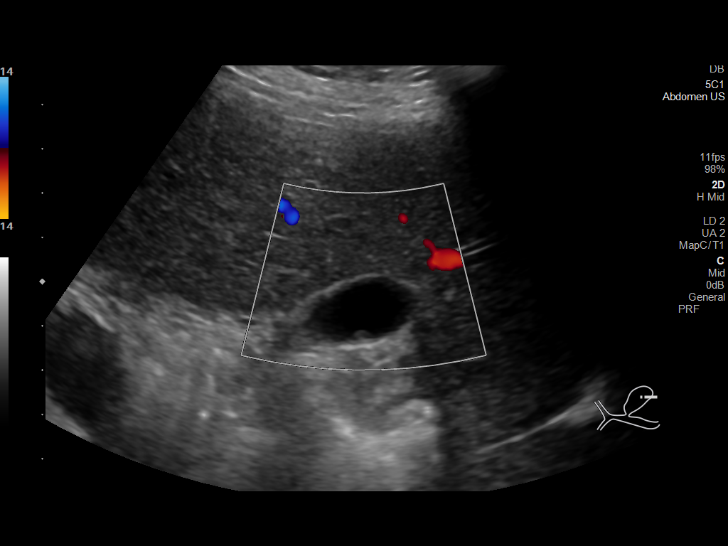
[im 30/118]
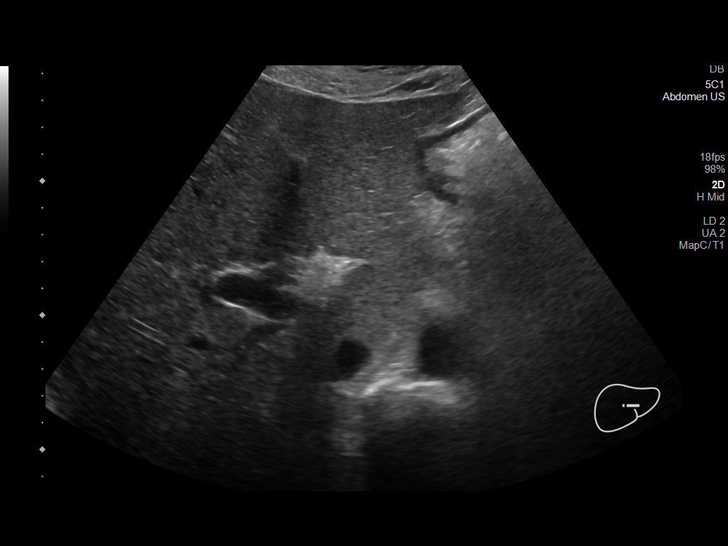
[im 40/118]
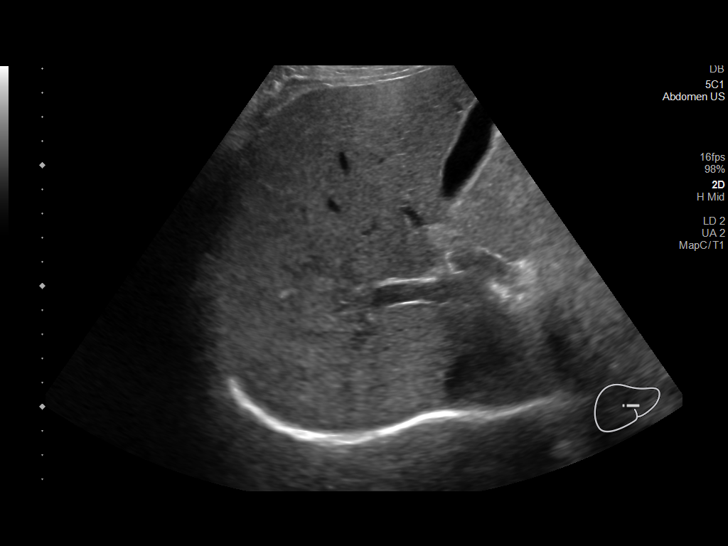
[im 49/118]
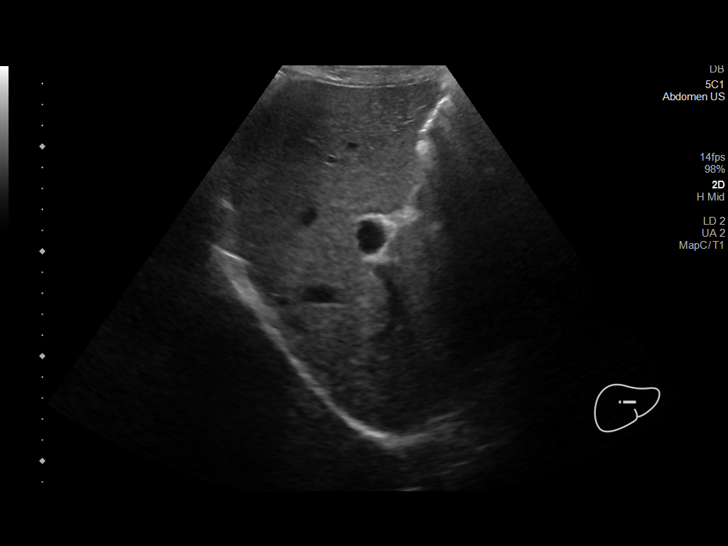
[im 59/118]
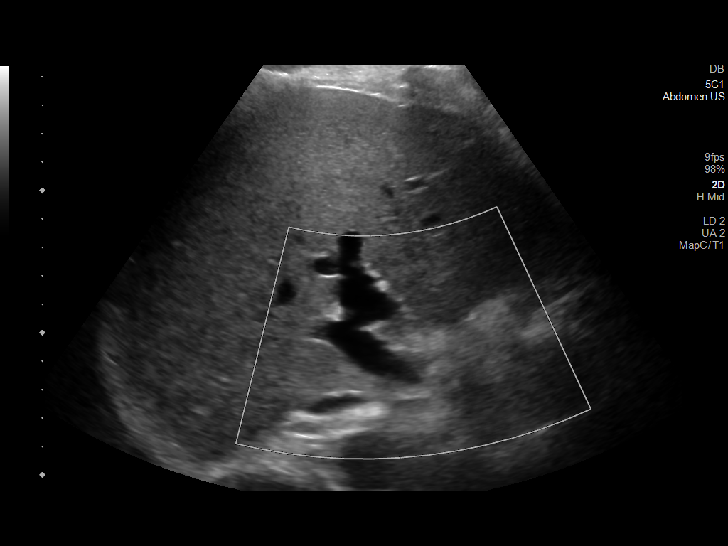
[im 69/118]
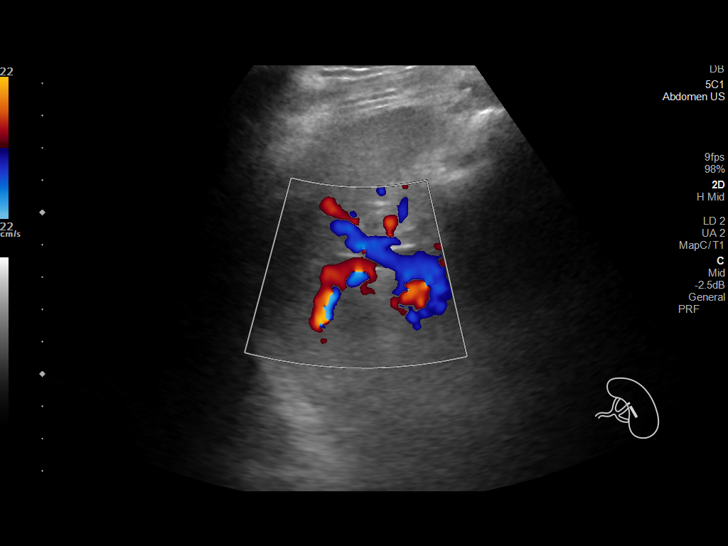
[im 79/118]
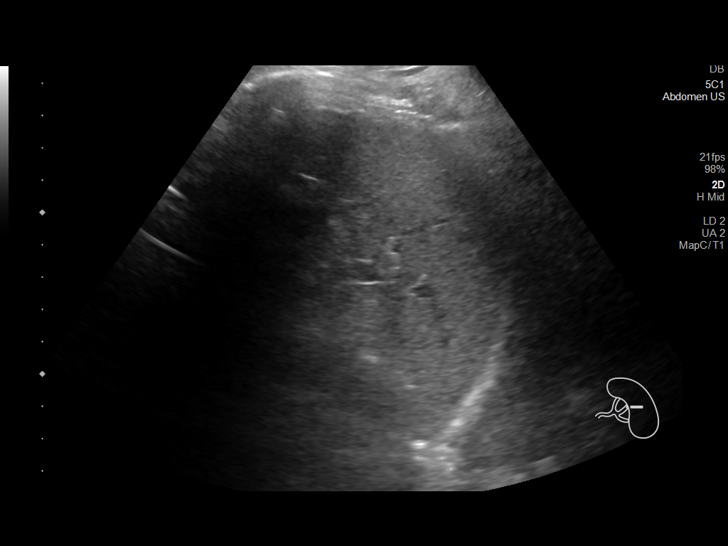
[im 88/118]
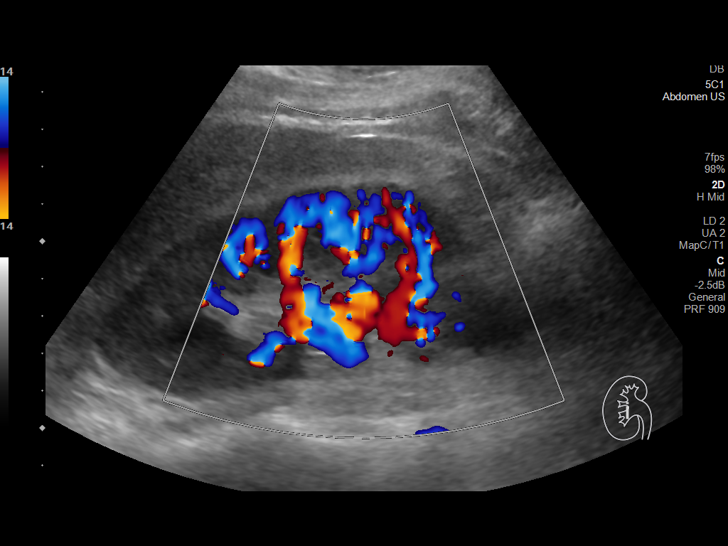
[im 98/118]
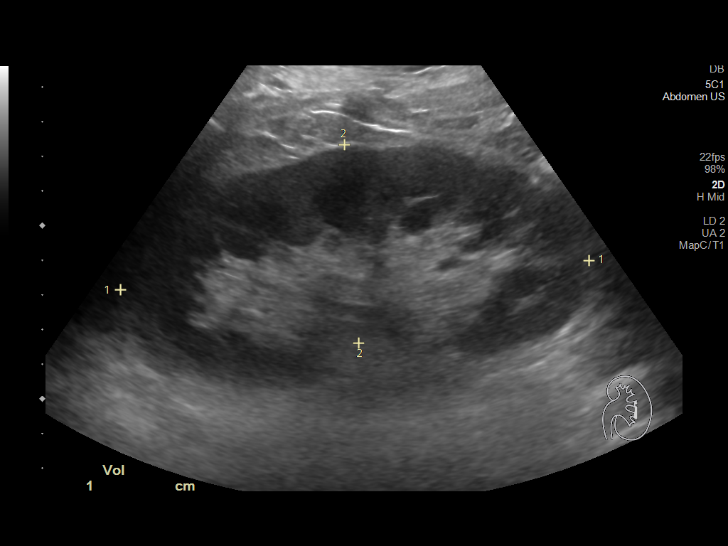
[im 108/118]
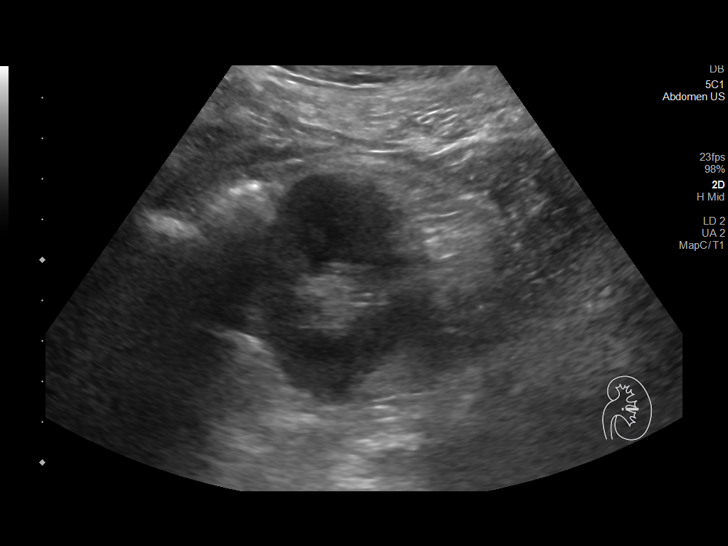
[im 118/118]
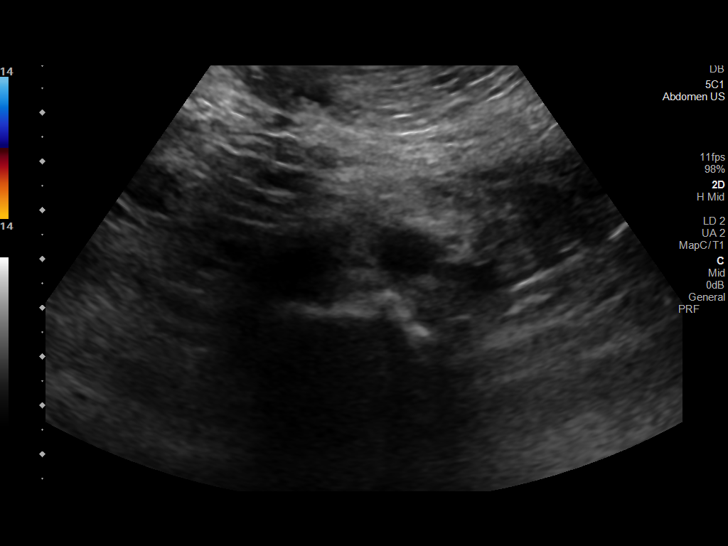

[13 of 25 positions shown; findings below may reference images not displayed]

FINDINGS: Gallbladder: Partially contracted gallbladder. Wall thickness is
within normal limits. Small 2-3 mm focus of adherent sludge versus
gallbladder polyp on image 20 appears inconsequential. No gallstone
identified. No sonographic Murphy sign elicited.

Common bile duct: Diameter: 4-5 mm, normal.

Liver: No focal lesion identified. Within normal limits in
parenchymal echogenicity. No intrahepatic biliary ductal dilatation.
Portal vein is patent on color Doppler imaging with normal direction
of blood flow towards the liver.

IVC: No abnormality visualized.

Pancreas: Visualized portion unremarkable.

Spleen: Size and appearance within normal limits.

Right Kidney: Length: 12.5 cm. Echogenicity within normal limits. No
mass or hydronephrosis visualized.

Left Kidney: Length: 13.6 cm. Echogenicity within normal limits. No
mass or hydronephrosis visualized.

Abdominal aorta: No aneurysm visualized.

Other findings: None.
IMPRESSION: 1. Questionable trace sludge versus tiny gallbladder polyp
(inconsequential). No cholelithiasis, and no evidence of acute
cholecystitis or bile duct obstruction.
2. Otherwise the normal ultrasound appearance of the abdomen.

## 2022-10-15 IMAGING — CT CT ABD-PELV W/ CM
2 of 5 series · 15 of 46 positions shown, 17 images · IV contrast (APPLIED)
Comparison: None

CLINICAL DATA: Abdominal pain, acute, nonlocalized. COVID pneumonia

EXAM:
CT ABDOMEN AND PELVIS WITH CONTRAST
TECHNIQUE: Multidetector CT imaging of the abdomen and pelvis was performed
using the standard protocol following bolus administration of
intravenous contrast.
CONTRAST:  80mL OMNIPAQUE IOHEXOL 350 MG/ML SOLN

[Series 3: abd/ pelvis 5.0 i30f 2 · axial · 0.85mm/px · z∈[-475,-20]mm · 12 of 103 slices shown, 14 images]
[im 6/103  soft-tissue]
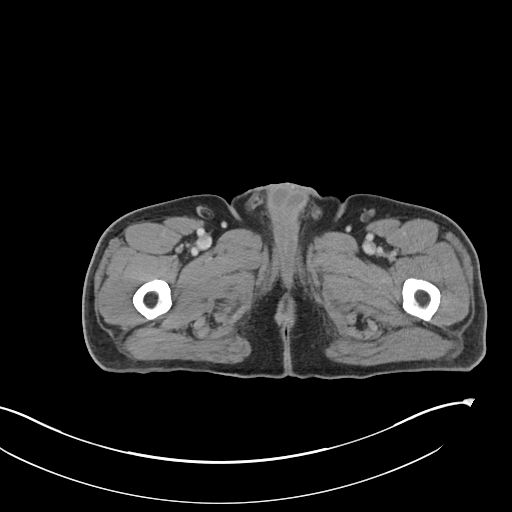
[im 6/103  bone]
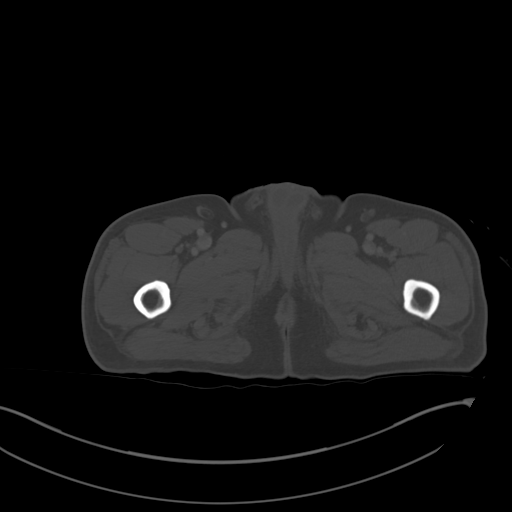
[im 18/103  soft-tissue]
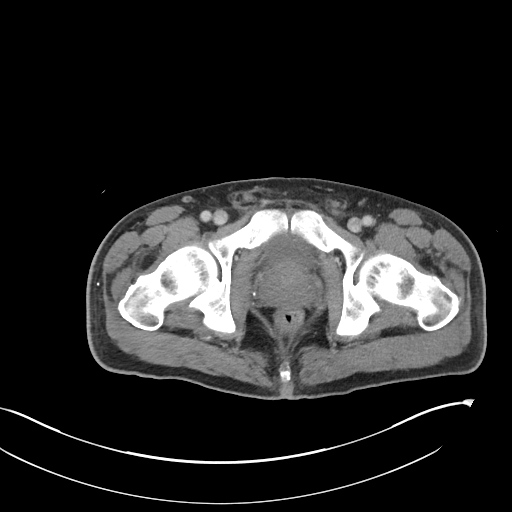
[im 23/103  soft-tissue]
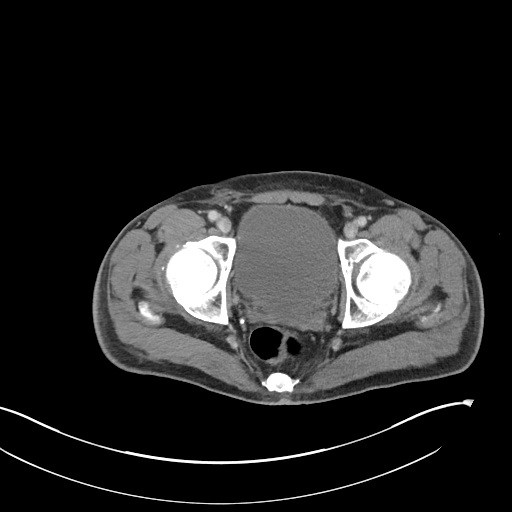
[im 29/103  soft-tissue]
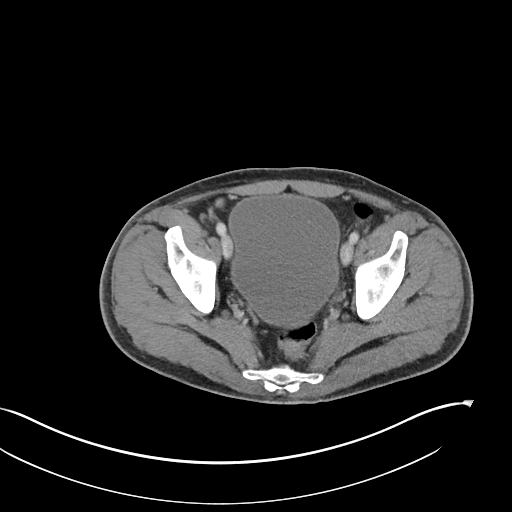
[im 40/103  soft-tissue]
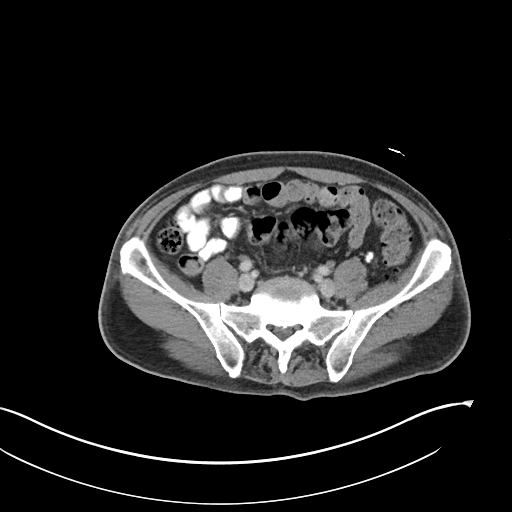
[im 46/103  soft-tissue]
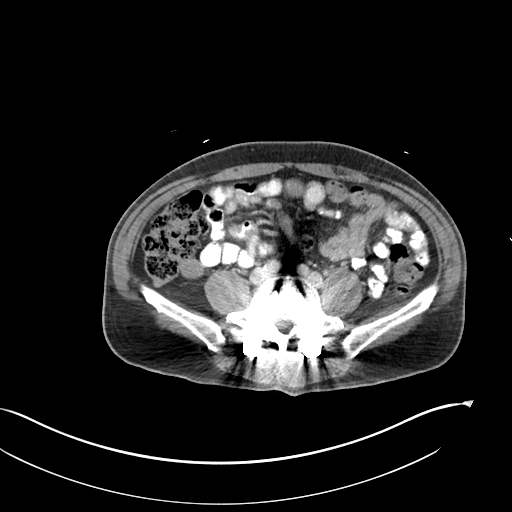
[im 57/103  soft-tissue]
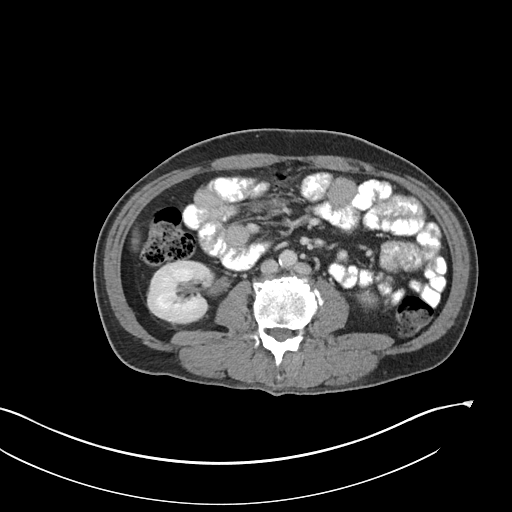
[im 63/103  soft-tissue]
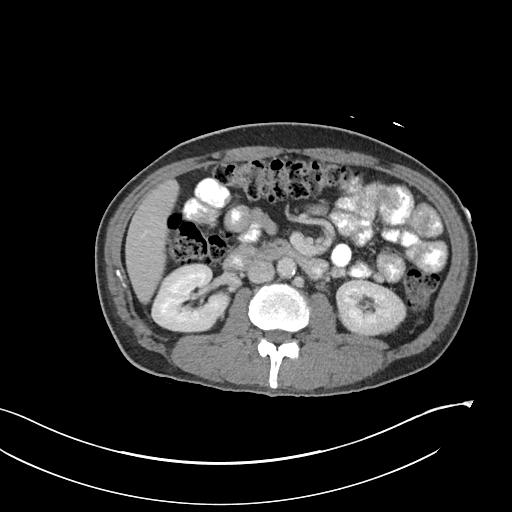
[im 74/103  soft-tissue]
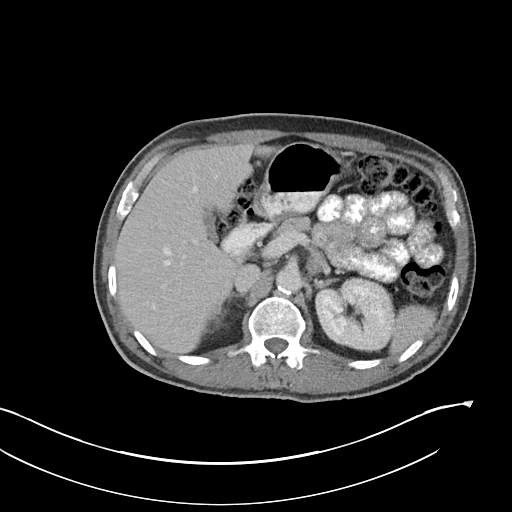
[im 74/103  bone]
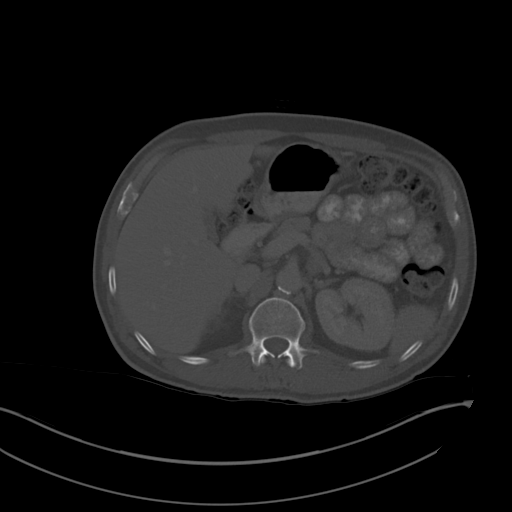
[im 80/103  soft-tissue]
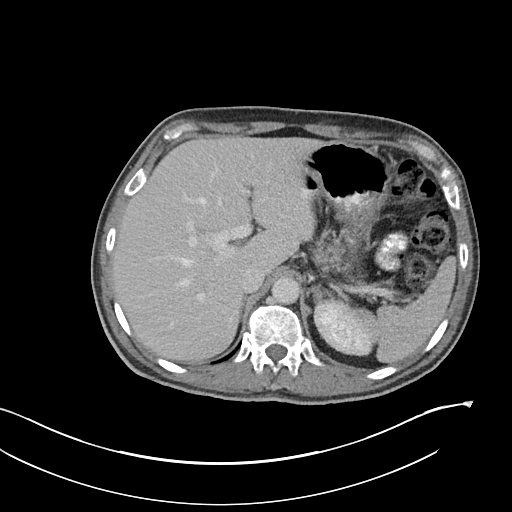
[im 86/103  soft-tissue]
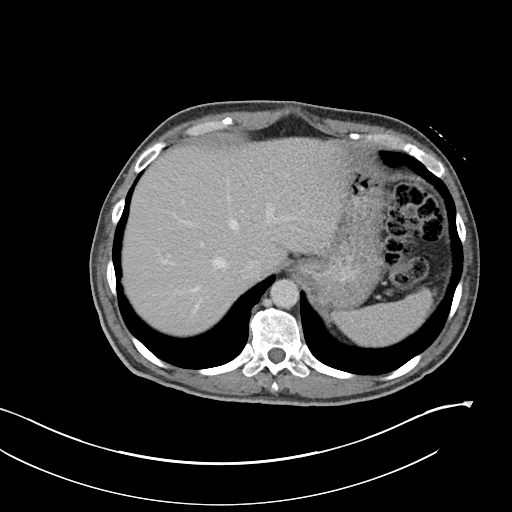
[im 97/103  soft-tissue]
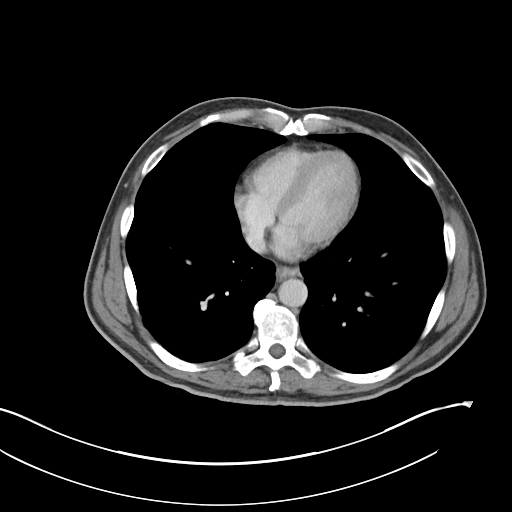

[Series 6: coronal soft tissue · coronal · 0.74mm/px · 3 of 101 slices shown]
[im 34/101  soft-tissue]
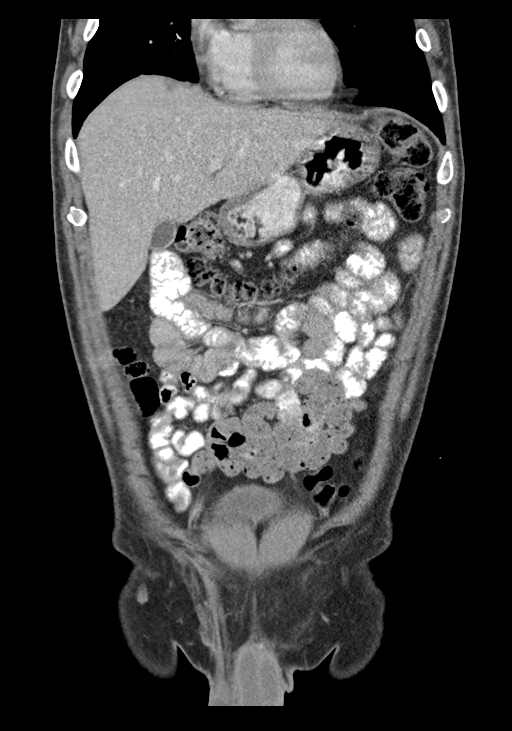
[im 45/101  soft-tissue]
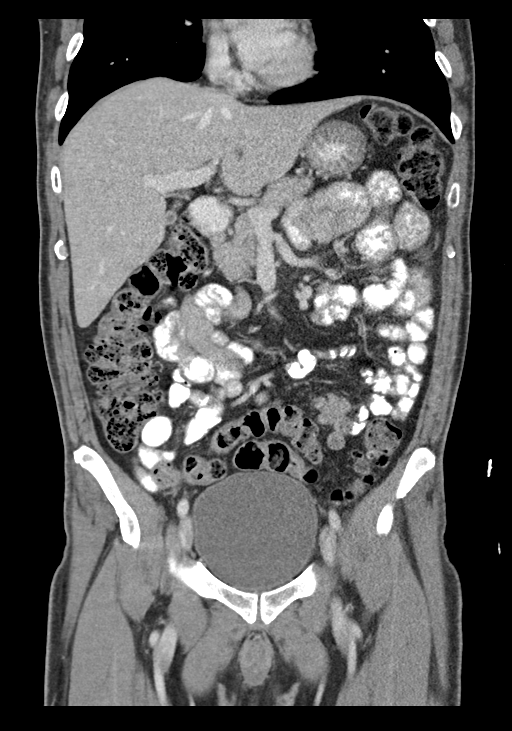
[im 56/101  soft-tissue]
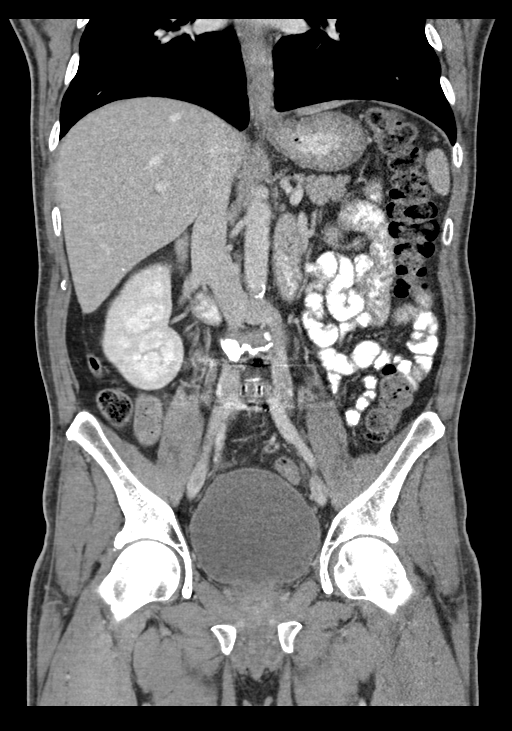

[15 of 46 positions shown; findings below may reference images not displayed]

FINDINGS: Lower chest: No acute abnormality.

Hepatobiliary: 15 cm hypodense lesion within the right hepatic dome
is compatible with a benign cavernous hemangioma. The liver is
otherwise unremarkable. Gallbladder unremarkable. No intra or
extrahepatic biliary ductal dilation.

Pancreas: Unremarkable

Spleen: Unremarkable

Adrenals/Urinary Tract: Adrenal glands are unremarkable. Kidneys are
normal, without renal calculi, focal lesion, or hydronephrosis.
Bladder is unremarkable.

Stomach/Bowel: Stomach is within normal limits. Appendix appears
normal. No evidence of bowel wall thickening, distention, or
inflammatory changes.

Vascular/Lymphatic: Variant anatomy with a no lytic or blastic
retroaortic left renal vein and duplicated inferior vena cava. Mild
atherosclerotic calcification within the aortoiliac vasculature. No
aortic aneurysm. No pathologic adenopathy within the abdomen and
pelvis.

Reproductive: Prostate is unremarkable.

Other: No abdominal wall hernia or abnormality. No abdominopelvic
ascites.

Musculoskeletal: No acute bone abnormality. L4-5 lumbar fusion with
instrumentation and posterior decompression of L4 has been
performed. No lytic or blastic bone lesion.
IMPRESSION: No acute intra-abdominal pathology identified. No definite
radiographic explanation for the patient's reported symptoms.

Aortic Atherosclerosis ([id number]).
